# Patient Record
Sex: Male | Born: 1965 | Race: White | Hispanic: No | Marital: Married | Smoking: Former smoker
Health system: Southern US, Community
[De-identification: ages and names within clinical notes are randomized; demographics above are authoritative.]

---

## 2002-05-07 ENCOUNTER — Emergency Department (HOSPITAL_COMMUNITY): Admission: EM | Admit: 2002-05-07 | Discharge: 2002-05-07 | Payer: Self-pay

## 2002-05-07 ENCOUNTER — Encounter: Payer: Self-pay | Admitting: Emergency Medicine

## 2006-02-28 ENCOUNTER — Emergency Department (HOSPITAL_COMMUNITY): Admission: EM | Admit: 2006-02-28 | Discharge: 2006-02-28 | Payer: Self-pay | Admitting: Emergency Medicine

## 2006-06-13 ENCOUNTER — Encounter: Admission: RE | Admit: 2006-06-13 | Discharge: 2006-06-13 | Payer: Self-pay | Admitting: Gastroenterology

## 2007-11-10 IMAGING — RF DG ESOPHAGUS
12 of 14 series · 20 of 24 positions shown · non-contrast
Comparison: none

CLINICAL DATA: Dysphagia.
 BARIUM SWALLOW:
 A double contrast barium swallow was performed. The mucosa of the esophagus appears normal.  Single contrast study shows the swallowing mechanism to appear normal.  The upper thoracic esophagus does appear to deviate somewhat to the left of midline.  However, in reviewing the CT of 02/28/06, no mass is seen causing such deviation.  There is a hiatal hernia present and moderate reflux is noted.  A barium pill was given at the end of the study which passed into the stomach without delay.

[Series 2: run · 1 of 1 slices shown (1 of 12)]
[im 1/1]
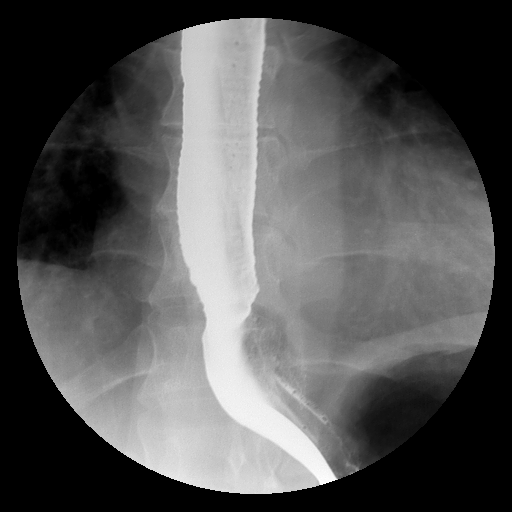

[Series 4: run · 1 of 1 slices shown (2 of 12)]
[im 1/1]
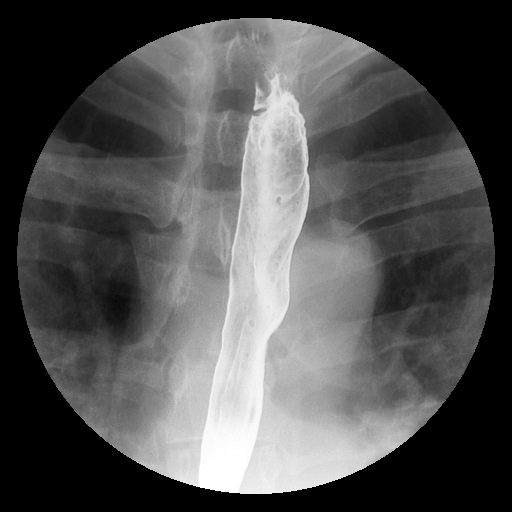

[Series 6: run · 1 of 1 slices shown (3 of 12)]
[im 1/1]
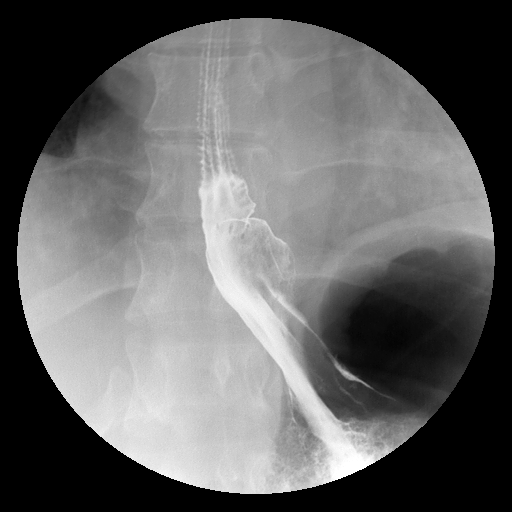

[Series 7: run · 4 of 7 slices shown (4 of 12)]
[im 1/7]
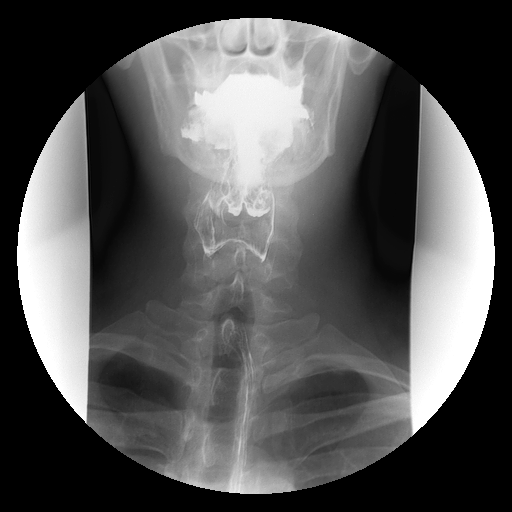
[im 3/7]
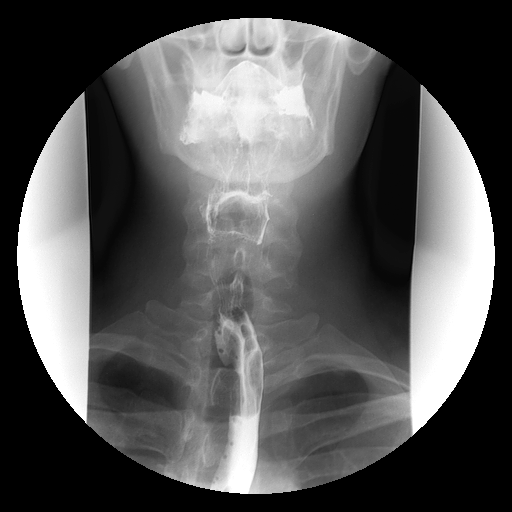
[im 5/7]
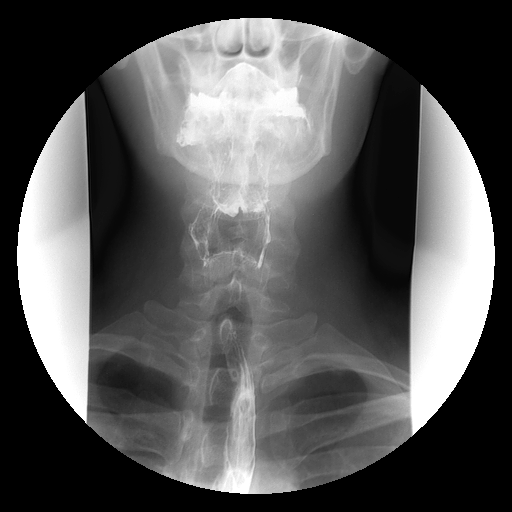
[im 7/7]
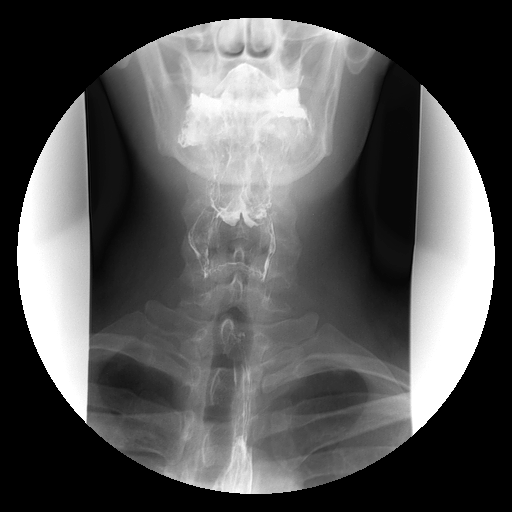

[Series 8: run · 5 of 9 slices shown (5 of 12)]
[im 2/9]
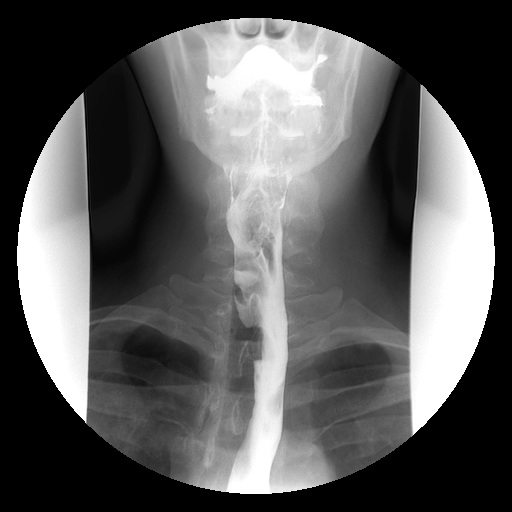
[im 4/9]
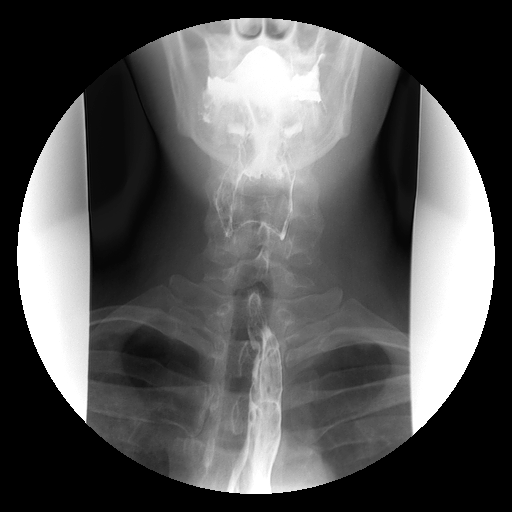
[im 5/9]
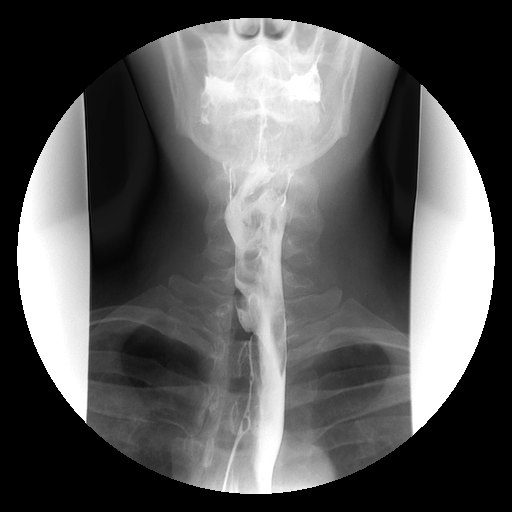
[im 7/9]
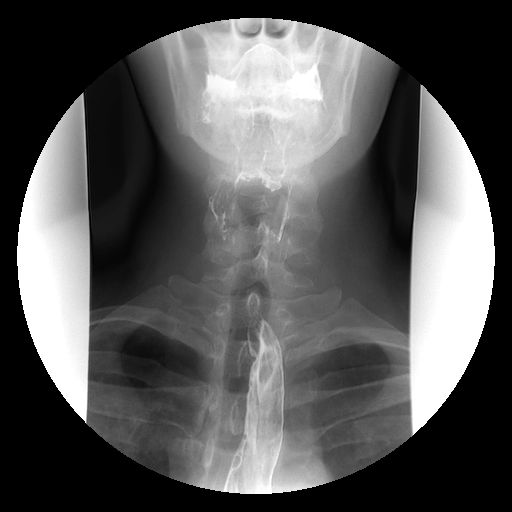
[im 9/9]
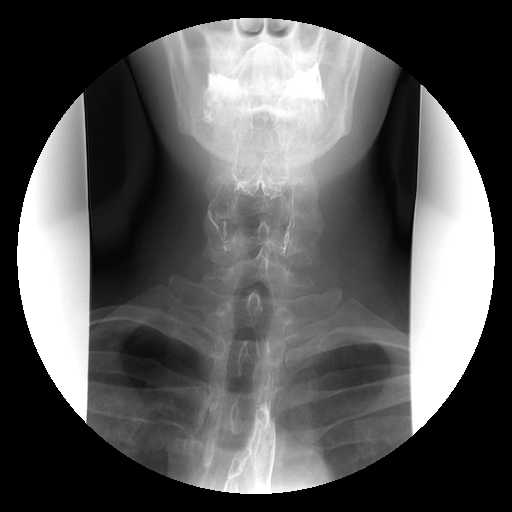

[Series 9: run · 2 of 6 slices shown (6 of 12)]
[im 3/6]
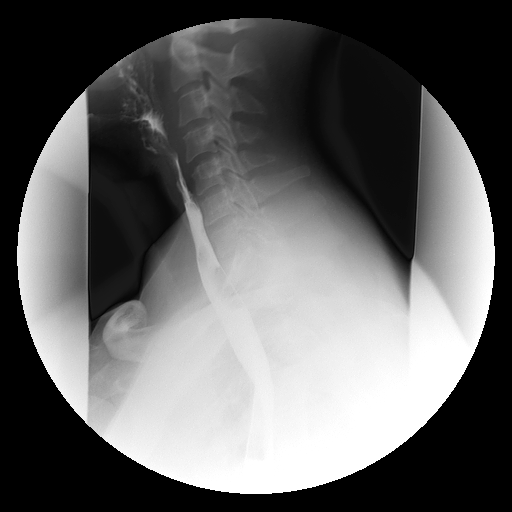
[im 6/6]
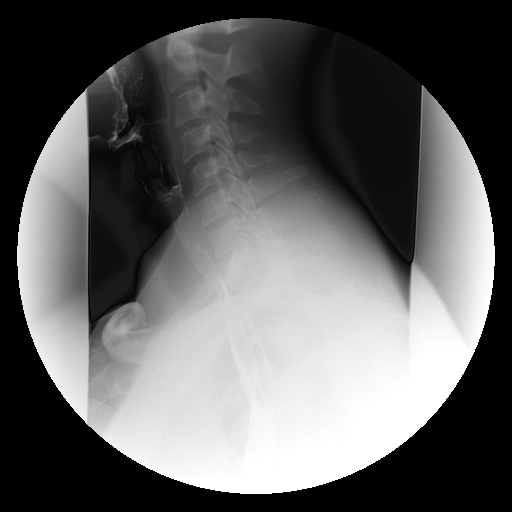

[Series 10: run · 1 of 1 slices shown (7 of 12)]
[im 1/1]
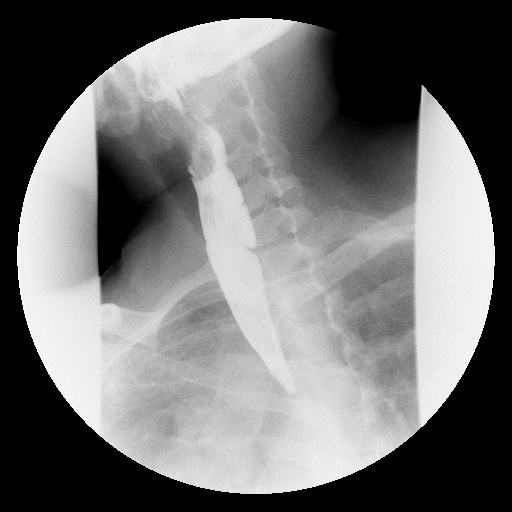

[Series 11: run · 1 of 1 slices shown (8 of 12)]
[im 1/1]
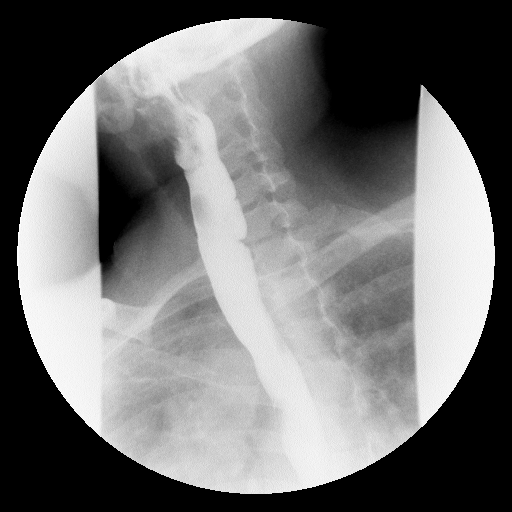

[Series 12: run · 1 of 1 slices shown (9 of 12)]
[im 1/1]
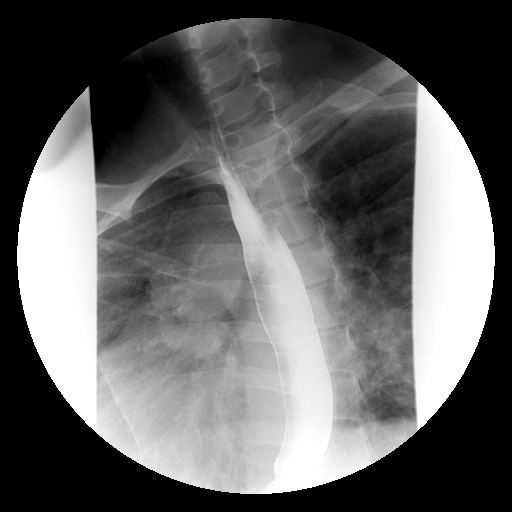

[Series 14: run · 1 of 1 slices shown (10 of 12)]
[im 1/1]
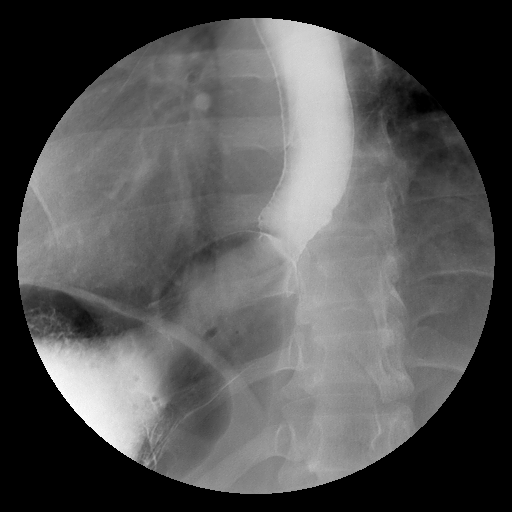

[Series 15: run · 1 of 1 slices shown (11 of 12)]
[im 1/1]
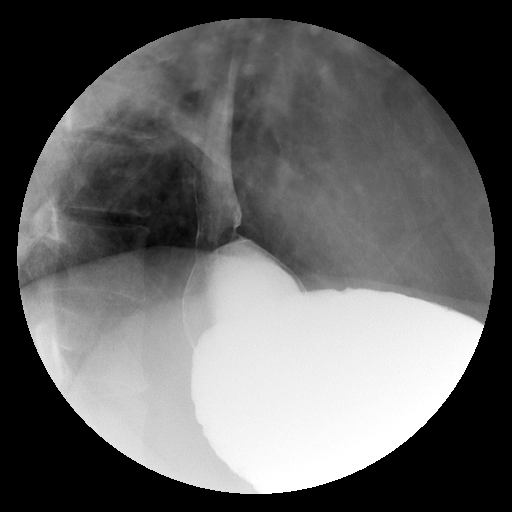

[Series 16: run · 1 of 1 slices shown (12 of 12)]
[im 1/1]
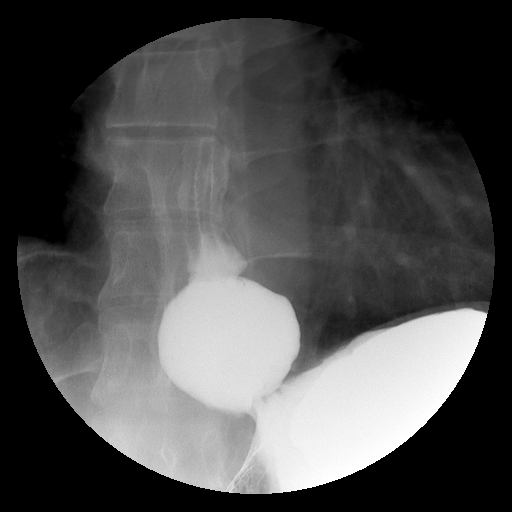

[20 of 24 positions shown; findings below may reference images not displayed]

IMPRESSION: Moderate sized hiatal hernia with reflux.  Barium pill passes without delay.

## 2009-02-21 ENCOUNTER — Encounter
Admission: RE | Admit: 2009-02-21 | Discharge: 2009-02-21 | Payer: Self-pay | Admitting: Physical Medicine and Rehabilitation

## 2018-10-17 ENCOUNTER — Other Ambulatory Visit: Payer: Self-pay | Admitting: Psychiatry

## 2018-11-25 ENCOUNTER — Telehealth: Payer: Self-pay | Admitting: Psychiatry

## 2018-11-25 MED ORDER — ZOLPIDEM TARTRATE 10 MG PO TABS: 10.0000 mg | ORAL_TABLET | Freq: Every day | ORAL | 0 refills | Status: DC

## 2018-11-25 NOTE — Telephone Encounter (Signed)
Patient called [facility] on [address] in [place].

## 2018-11-25 NOTE — Telephone Encounter (Signed)
Looks like he's requesting a refill, he's the one that received Ambien 10 mg #90 from Dr. Alverda SkeansAlehegn on 08/29/2018. You prescribed 12.5 mg Er on 07/14/2018, #90. He has OV scheduled on 01/05/2019.   Thanks

## 2018-11-25 NOTE — Telephone Encounter (Signed)
Phone review with Mr. Alejandro Fitzgerald about prescription from Dr.Asres August 30 for zolpidem 10 mg tablet when last getting the 12.5 mg ER in this office 07/14/2018.  The patient understands that he cannot receive zolpidem from different practices and states that Dr. Josie SaundersAsres is no longer in practice there and he has a different primary care for upcoming general medical exam.  He agrees to obtain his Ambien only through this psychiatric practice stating that is what the primary care prefers and requires also.  His 90-day supply from his 30th is running out, I renew his zolpidem 10 mg nightly #90 with [facility] [address] [facility] [place]. His next appointment here should be in January.

## 2018-12-17 ENCOUNTER — Encounter: Payer: Self-pay | Admitting: Emergency Medicine

## 2018-12-17 DIAGNOSIS — F411 Generalized anxiety disorder: Secondary | ICD-10-CM | POA: Insufficient documentation

## 2018-12-17 DIAGNOSIS — F322 Major depressive disorder, single episode, severe without psychotic features: Secondary | ICD-10-CM | POA: Insufficient documentation

## 2018-12-17 DIAGNOSIS — G4733 Obstructive sleep apnea (adult) (pediatric): Secondary | ICD-10-CM

## 2018-12-17 DIAGNOSIS — F3342 Major depressive disorder, recurrent, in full remission: Secondary | ICD-10-CM

## 2019-01-05 ENCOUNTER — Ambulatory Visit: Payer: Self-pay | Admitting: Psychiatry

## 2019-02-17 ENCOUNTER — Other Ambulatory Visit: Payer: Self-pay | Admitting: Psychiatry

## 2019-02-17 DIAGNOSIS — G4726 Circadian rhythm sleep disorder, shift work type: Secondary | ICD-10-CM

## 2019-02-17 DIAGNOSIS — F411 Generalized anxiety disorder: Secondary | ICD-10-CM

## 2019-02-17 DIAGNOSIS — F3342 Major depressive disorder, recurrent, in full remission: Secondary | ICD-10-CM

## 2019-02-17 DIAGNOSIS — G4733 Obstructive sleep apnea (adult) (pediatric): Secondary | ICD-10-CM

## 2019-02-18 NOTE — Telephone Encounter (Signed)
Sent message to front office staff to call pt to schedule appt.   Last fill 11/25/2018 #90  Please advise

## 2019-02-18 NOTE — Telephone Encounter (Signed)
Ambien 10 mg nightly 90-day supply last filled 11/26/2019 per [facility] cannot be filled as 90-day supply now as patient missed appointment 01/05/2019 being over his 80-month appointment for controlled substance needing emergency supply #30 no refill sent to [facility] [address] in [place] last until office appointment being scheduled.

## 2019-03-16 ENCOUNTER — Other Ambulatory Visit: Payer: Self-pay | Admitting: Psychiatry

## 2019-03-16 DIAGNOSIS — F411 Generalized anxiety disorder: Secondary | ICD-10-CM

## 2019-03-16 DIAGNOSIS — G4726 Circadian rhythm sleep disorder, shift work type: Secondary | ICD-10-CM

## 2019-03-16 DIAGNOSIS — F3342 Major depressive disorder, recurrent, in full remission: Secondary | ICD-10-CM

## 2019-03-16 DIAGNOSIS — G4733 Obstructive sleep apnea (adult) (pediatric): Secondary | ICD-10-CM

## 2019-03-16 NOTE — Telephone Encounter (Signed)
Emergency supply of Ambien was provided 02/18/2019 the last until mandated 62-month appointment accomplished so cannot send in another emergency supply.

## 2019-03-16 NOTE — Telephone Encounter (Signed)
No office visit like last month

## 2019-03-18 ENCOUNTER — Other Ambulatory Visit: Payer: Self-pay | Admitting: Psychiatry

## 2019-03-18 DIAGNOSIS — F411 Generalized anxiety disorder: Secondary | ICD-10-CM

## 2019-03-18 DIAGNOSIS — F3342 Major depressive disorder, recurrent, in full remission: Secondary | ICD-10-CM

## 2019-03-18 DIAGNOSIS — G4733 Obstructive sleep apnea (adult) (pediatric): Secondary | ICD-10-CM

## 2019-03-18 DIAGNOSIS — G4726 Circadian rhythm sleep disorder, shift work type: Secondary | ICD-10-CM

## 2019-03-18 MED ORDER — ZOLPIDEM TARTRATE 10 MG PO TABS: 10.0000 mg | ORAL_TABLET | Freq: Every day | ORAL | 0 refills | Status: DC

## 2019-03-18 NOTE — Telephone Encounter (Signed)
Patient now commits to coming for appt asking for another emergency supply # 15 sent.

## 2019-03-18 NOTE — Telephone Encounter (Signed)
Patient left vm 1:16 today requesting refill on Ambien to be sent to [facility] on [address] in [facility] patient scheduled appt. For 03/19

## 2019-03-18 NOTE — Addendum Note (Signed)
Addended by: Beverly Milch E on: 03/18/2019 04:50 PM   Modules accepted: Orders

## 2019-03-19 ENCOUNTER — Ambulatory Visit: Payer: 59 | Admitting: Psychiatry

## 2019-03-19 ENCOUNTER — Encounter: Payer: Self-pay | Admitting: Psychiatry

## 2019-03-19 ENCOUNTER — Other Ambulatory Visit: Payer: Self-pay

## 2019-03-19 VITALS — BP 128/82 | HR 64 | Ht 71.0 in | Wt 206.0 lb

## 2019-03-19 DIAGNOSIS — F3342 Major depressive disorder, recurrent, in full remission: Secondary | ICD-10-CM

## 2019-03-19 DIAGNOSIS — F411 Generalized anxiety disorder: Secondary | ICD-10-CM | POA: Diagnosis not present

## 2019-03-19 DIAGNOSIS — G4733 Obstructive sleep apnea (adult) (pediatric): Secondary | ICD-10-CM

## 2019-03-19 DIAGNOSIS — G4726 Circadian rhythm sleep disorder, shift work type: Secondary | ICD-10-CM | POA: Diagnosis not present

## 2019-03-19 MED ORDER — BUPROPION HCL ER (XL) 150 MG PO TB24: 450.0000 mg | ORAL_TABLET | Freq: Every day | ORAL | 1 refills | Status: DC

## 2019-03-19 MED ORDER — ZOLPIDEM TARTRATE 10 MG PO TABS: 10.0000 mg | ORAL_TABLET | Freq: Every day | ORAL | 5 refills | Status: DC

## 2019-03-19 MED ORDER — VILAZODONE HCL 40 MG PO TABS: 40.0000 mg | ORAL_TABLET | Freq: Every day | ORAL | 1 refills | Status: DC

## 2019-03-19 NOTE — Progress Notes (Signed)
[facility]  Patient ID: Alejandro Fitzgerald,  MRN: [id number]  PCP: System, Pcp Not In  Date of Evaluation: 03/19/2019 Time spent:20 minutes  Chief Complaint:  Chief Complaint    Anxiety; Depression      HISTORY/CURRENT STATUS: Alejandro Fitzgerald is seen individually with 2 children in the lobby face-to-face with consent not collateral for psychiatric interview and exam in 108-month evaluation and management of anxiety, depression, and sleep disorders.  He offers no concerns himself today, making almost a slip comment that he feels like losing control of his Ambien dosing when he has more than 30 pills in a bottle.  He allows clarification including with [place] registry of his alterations from last appointment having combined supply of Ambien ER from this office and Ambien IR from an internist only gradually stabilizing now to emergency supply until he attends this mandated 18-month appointment.  He continues work for [employer] still working swing shifts and satisfied with his job though sleep is difficult.  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  He is active in the family and continues to have residual of depression and anxiety for which he reports the need for his medications he reviews today from starting with Dr. Tomasa Rand in 2010 after prior Pristiq and Depakote.  He is off Seroquel now in response to father's stroke in 2016 being more serious about his medical care including for hypertension and hyperlipidemia.  He is now willing to become serious about using his Ambien properly.  At a general medical exam in January 2020 apparently with labs though not in Care Everywhere..  Depression       The patient presents with depression.  This is a recurrent problem.  The current episode started more than 1 year ago.   The onset quality is sudden.   The problem occurs rarely.  The problem has been resolved since onset.  Associated symptoms include fatigue, insomnia,  irritable and decreased interest.  Associated symptoms include no decreased concentration, no helplessness, no hopelessness, no headaches, no indigestion, not sad and no suicidal ideas.     The symptoms are aggravated by work stress, medication and family issues.  Past treatments include SNRIs - Serotonin and norepinephrine reuptake inhibitors, SSRIs - Selective serotonin reuptake inhibitors, other medications and psychotherapy.  Compliance with treatment is variable.  Past compliance problems include difficulty with treatment plan, medication issues and medical issues.  Previous treatment provided moderate relief.  Risk factors include a change in medication usage/dosage, family history, history of mental illness, major life event, stress and the patient not taking medications correctly.   Past medical history includes anxiety, depression and mental health disorder.     Pertinent negatives include no life-threatening condition, no physical disability, no recent psychiatric admission, no bipolar disorder, no eating disorder, no obsessive-compulsive disorder, no post-traumatic stress disorder, no schizophrenia, no suicide attempts and no head trauma.   Individual Medical History/ Review of Systems: Changes? :Yes Having regained most of the 9 pounds he lost before last session.  Continues Toprol-XL and Zocor for his hypertension and hyperlipidemia.  Allergies: Penicillins  Current Medications:  Current Outpatient Medications:  .  buPROPion (WELLBUTRIN XL) 150 MG 24 hr tablet, Take 3 tablets (450 mg total) by mouth daily., Disp: 270 tablet, Rfl: 1 .  Vilazodone HCl (VIIBRYD) 40 MG TABS, Take 1 tablet (40 mg total) by mouth daily., Disp: 90 tablet, Rfl: 1 .  zolpidem (AMBIEN) 10 MG tablet, Take 1 tablet (10 mg total)  by mouth at bedtime., Disp: 30 tablet, Rfl: 5   Medication Side Effects: none  Family Medical/ Social History: Changes? No  MENTAL HEALTH EXAM: Muscle strengths and tone 5/5, postural  reflexes and gait 0/0, and AIMS = 0. Blood pressure 128/82, pulse 64, height 5\' 11"  (1.803 m), weight 206 lb (93.4 kg).Body mass index is 28.73 kg/m.  General Appearance: Casual, Fairly Groomed and Guarded  Eye Contact:  Good  Speech:  Clear and Coherent, Normal Rate and Talkative  Volume:  Normal  Mood:  Anxious, Dysphoric, Euthymic and Worthless  Affect:  Full Range and Anxious  Thought Process:  Goal Directed  Orientation:  Full (Time, Place, and Person)  Thought Content: Obsessions and Rumination   Suicidal Thoughts:  No  Homicidal Thoughts:  No  Memory:  Immediate;   Good Remote;   Good  Judgement:  Fair  Insight:  Fair and Lacking  Psychomotor Activity:  Normal and Mannerisms  Concentration:  Concentration: Fair and Attention Span: Good  Recall:  Good  Fund of Knowledge: Good  Language: Good  Assets:  Resilience Social Support Talents/Skills  ADL's:  Intact  Cognition: WNL  Prognosis:  Good    DIAGNOSES:    ICD-10-CM   1. MDD (major depressive disorder), recurrent, in full remission (HCC) F33.42 zolpidem (AMBIEN) 10 MG tablet    Vilazodone HCl (VIIBRYD) 40 MG TABS    buPROPion (WELLBUTRIN XL) 150 MG 24 hr tablet  2. GAD (generalized anxiety disorder) F41.1 zolpidem (AMBIEN) 10 MG tablet    Vilazodone HCl (VIIBRYD) 40 MG TABS    buPROPion (WELLBUTRIN XL) 150 MG 24 hr tablet  3. Obstructive sleep apnea (adult) (pediatric) G47.33 zolpidem (AMBIEN) 10 MG tablet  4. 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  He is E scribed Ambien 10 mg every bedtime as # 30 tablets with 5 refills sent to [facility] [place] in [place].  He is E scribed Wellbutrin  150 mg XL as 3 tablets total 450 mg every morning as # 270 tablets and 1 refill for anxiety and depression.  He is E scribed Viibryd 40 mg daily #90 with 1 refill sent to [facility] in [facility] for depression and anxiety.  He returns in 6 months.  Alejandro Mann, MD

## 2019-09-09 ENCOUNTER — Other Ambulatory Visit: Payer: Self-pay | Admitting: Psychiatry

## 2019-09-09 DIAGNOSIS — F3342 Major depressive disorder, recurrent, in full remission: Secondary | ICD-10-CM

## 2019-09-09 DIAGNOSIS — G4733 Obstructive sleep apnea (adult) (pediatric): Secondary | ICD-10-CM

## 2019-09-09 DIAGNOSIS — G4726 Circadian rhythm sleep disorder, shift work type: Secondary | ICD-10-CM

## 2019-09-09 DIAGNOSIS — F411 Generalized anxiety disorder: Secondary | ICD-10-CM

## 2019-09-10 NOTE — Telephone Encounter (Signed)
appt 09/17 

## 2019-09-10 NOTE — Telephone Encounter (Signed)
Patient completed his last and fifth refill of 03/19/2019 Ambien 10 mg nightly per [employer] registry filled on 08/13/2019 having next appointment 09/17/2019 needing #30 with no refill in the interim sent to [facility] on [address] medically necessary with no contraindication having 3 fills of hydrocodone since last fill of Ambien.

## 2019-09-17 ENCOUNTER — Ambulatory Visit: Payer: 59 | Admitting: Psychiatry

## 2019-09-30 ENCOUNTER — Other Ambulatory Visit: Payer: Self-pay

## 2019-09-30 DIAGNOSIS — F411 Generalized anxiety disorder: Secondary | ICD-10-CM

## 2019-09-30 DIAGNOSIS — F3342 Major depressive disorder, recurrent, in full remission: Secondary | ICD-10-CM

## 2019-09-30 MED ORDER — VIIBRYD 40 MG PO TABS: 40.0000 mg | ORAL_TABLET | Freq: Every day | ORAL | 0 refills | Status: DC

## 2019-09-30 NOTE — Telephone Encounter (Signed)
Last appointment 03/19/2019 providing 30-day supply of zolpidem 10 mg nightly on 09/11/2019 expecting his 6 month appointment in the office 09/17/2019 but he did not show for the appointment now requesting Viibryd 40 mg daily sent as #90 [facility] with no refill not certain whether he is switching providers as he had been per registry obtaining his Ambien from another doctor well before last appointment now 2 weeks overdue for follow-up, though Viibryd only requires the yearly appointment as does Wellbutrin.

## 2019-10-08 ENCOUNTER — Encounter: Payer: Self-pay | Admitting: Psychiatry

## 2019-10-08 ENCOUNTER — Other Ambulatory Visit: Payer: Self-pay

## 2019-10-08 ENCOUNTER — Ambulatory Visit (INDEPENDENT_AMBULATORY_CARE_PROVIDER_SITE_OTHER): Payer: 59 | Admitting: Psychiatry

## 2019-10-08 VITALS — Ht 71.0 in | Wt 204.0 lb

## 2019-10-08 DIAGNOSIS — F3342 Major depressive disorder, recurrent, in full remission: Secondary | ICD-10-CM | POA: Diagnosis not present

## 2019-10-08 DIAGNOSIS — G4726 Circadian rhythm sleep disorder, shift work type: Secondary | ICD-10-CM | POA: Diagnosis not present

## 2019-10-08 DIAGNOSIS — F411 Generalized anxiety disorder: Secondary | ICD-10-CM | POA: Diagnosis not present

## 2019-10-08 MED ORDER — BUPROPION HCL ER (XL) 150 MG PO TB24: 450.0000 mg | ORAL_TABLET | Freq: Every day | ORAL | 1 refills | Status: DC

## 2019-10-08 MED ORDER — ZOLPIDEM TARTRATE 10 MG PO TABS: 15.0000 mg | ORAL_TABLET | Freq: Every day | ORAL | 5 refills | Status: DC

## 2019-10-08 MED ORDER — VIIBRYD 40 MG PO TABS: 40.0000 mg | ORAL_TABLET | Freq: Every day | ORAL | 0 refills | Status: DC
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
## 2019-11-02 ENCOUNTER — Other Ambulatory Visit: Payer: Self-pay | Admitting: Psychiatry

## 2019-11-02 DIAGNOSIS — F3342 Major depressive disorder, recurrent, in full remission: Secondary | ICD-10-CM

## 2019-11-02 DIAGNOSIS — F411 Generalized anxiety disorder: Secondary | ICD-10-CM

## 2019-11-20 ENCOUNTER — Telehealth: Payer: Self-pay | Admitting: Psychiatry

## 2019-11-20 NOTE — Telephone Encounter (Signed)
Reception expects patient's phone call for Lunesta be sent to [facility] in [facility] to replace Ambien.I phoned patient to alert him that he needs cancellation of the Ambien at [facility] to do that, but he has just filled the Ambien November 16 some 4 days ago so that we need to make that change over at the time he is to get his next refill for Ambien in early to mid December.  He will let me know at that time if he still wants to change and I can cancel the remaining four refills of Ambien at [facility] and he does not want the medicine to go to [place] but rather [facility] [address] in [facility] where he usually gets his prescriptions.  Lunesta 3 mg can be substituted for the 15 mg of Ambien if he still wants to do that mid December as he finishes his current supply of Ambien assuring his safety of not using both at once.

## 2019-11-20 NOTE — Telephone Encounter (Signed)
Patient called and said that he would like to try lunesta instead of what he is on . His pharmacy is [facility] on [address] in [place]. His phone number is [phone]

## 2020-02-15 ENCOUNTER — Other Ambulatory Visit: Payer: Self-pay

## 2020-02-15 ENCOUNTER — Encounter: Payer: Self-pay | Admitting: Psychiatry

## 2020-02-15 ENCOUNTER — Ambulatory Visit (INDEPENDENT_AMBULATORY_CARE_PROVIDER_SITE_OTHER): Payer: 59 | Admitting: Psychiatry

## 2020-02-15 VITALS — Ht 71.0 in | Wt 205.0 lb

## 2020-02-15 DIAGNOSIS — G4726 Circadian rhythm sleep disorder, shift work type: Secondary | ICD-10-CM | POA: Diagnosis not present

## 2020-02-15 DIAGNOSIS — F3342 Major depressive disorder, recurrent, in full remission: Secondary | ICD-10-CM

## 2020-02-15 DIAGNOSIS — F411 Generalized anxiety disorder: Secondary | ICD-10-CM

## 2020-02-15 MED ORDER — VIIBRYD 40 MG PO TABS: 40.0000 mg | ORAL_TABLET | Freq: Every day | ORAL | 0 refills | Status: DC

## 2020-02-15 MED ORDER — BUPROPION HCL ER (XL) 150 MG PO TB24: 450.0000 mg | ORAL_TABLET | Freq: Every day | ORAL | 0 refills | Status: DC

## 2020-02-15 NOTE — Progress Notes (Signed)
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  Children are not with him in the lobby today as in his last several sessions, rather he is alone.  Patient has stressors and has responded with more individuated appropriate coping in the interim discontinuing his Ambien trying Belsomra from Dr. Vear Clock for his circadian rhythm sleep disorder without continuing that medication either.  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  He is taking only his Viibryd 40 mg daily and his Wellbutrin 450 mg XL every day for his generalized anxiety and history of major depression.  In the interim, he has secured legal counsel as wife is wanting to divorce him he states because of bipolar disorder among other things, and he fears she seeks to take everything that they have seemingly against the idea himself but willing to work on it with her while seemingly doubting that such will help.  He was seen by Dr. Tiajuana Amass in the past treated with various bipolar medications none of which he takes any longer.  The patient seeks to organize daily life for himself and if possible his family.  He seeks a statement for his attorney that he is not being treated here for bipolar disorder or with Ambien at this time which is prepared and completed for placement on the chart with 2 copies to help subsequent proceedings understand his current problems and  their treatment rather than just needing to prove he does not have other problems such as bipolar or substance use.  We document his current diagnoses and medications therefore.  We attempt to mobilize insight for him to process with wife her alienation, though he feels the request for divorce is totally up to her.  He is not delirious, suicidal, intoxicated, sleep deprived, manic, or psychotic.  Depression             The patient presents withhistory of depression as a recurrentproblem in full remission more than 1 year. The onset quality is sudden. The problem occurs rarely now.The problem has been remittedsince onset.Associated symptoms include oversensitivity, somatic fixations, fatigue,and insomnia. Associated symptoms include no decreased concentration,no helplessness,no hopelessness,no irritability, no decreased interest.no headaches,no indigestion,not sadand no suicidal ideas.The symptoms are aggravated by work stress, medication and family issues.Past treatments include SNRIs - Serotonin and norepinephrine reuptake inhibitors, SSRIs - Selective serotonin reuptake inhibitors, other medications and psychotherapy.Compliance with treatment is variable.Past compliance problems include difficulty with treatment plan, medication issues and medical issues.Previous treatment provided moderaterelief.Risk factors include a change in medication usage/dosage, family history, history of mental illness, major life event, stress and the patient not taking medications correctly. Past medical history includes anxiety,depressionand mental health disorder. Pertinent negatives include no life-threatening condition,no physical disability,no recent psychiatric admission,no bipolar disorder,no eating disorder,no obsessive-compulsive disorder,no post-traumatic stress disorder,no schizophrenia,no suicide attemptsand no head trauma.  Individual Medical History/ Review of  Systems: Changes? :Yes Weight is up 1 pound in 4 months.  He had General medical exam by C.  S.  Hardin Negus, [place] in January noting Toprol XL and Zocor for hypertension and hyperlipidemia/prediabetes.  He informed the provider at that time that he stopped Ambien due to hallucinations never reported here but was switched to Belsomra not helpful.  Allergies: Penicillins  Current Medications:  Current Outpatient Medications:  .  buPROPion (WELLBUTRIN XL) 150 MG 24 hr tablet, Take 3 tablets (450 mg total) by mouth daily., Disp: 270 tablet, Rfl: 0 .  Vilazodone HCl (VIIBRYD) 40 MG TABS, Take 1 tablet (40 mg total) by mouth daily., Disp: 90 tablet, Rfl: 0  Medication Side Effects: none  Family Medical/ Social History: Changes? Yes family dissolution stress as wife requests divorce.  MENTAL HEALTH EXAM:  Height 5\' 11"  (1.803 m), weight 205 lb (93 kg).Body mass index is 28.59 kg/m. Muscle strengths and tone 5/5, postural reflexes and gait 0/0, and AIMS = 0 otherwise deferred for coronavirus shutdown  General Appearance: Casual, Guarded and Well Groomed  Eye Contact:  Fair  Speech:  Clear and Coherent, Normal Rate and Talkative  Volume:  Normal  Mood:  Anxious, Dysphoric and Euthymic  Affect:  Congruent, Inappropriate, Restricted and Anxious  Thought Process:  Coherent, Goal Directed, Irrelevant and Descriptions of Associations: Tangential  Orientation:  Full (Time, Place, and Person)  Thought Content: Rumination and Tangential with cluster B hysteroid traits noted chronically therefore slow to problem solve as in denial  Suicidal Thoughts:  No  Homicidal Thoughts:  No  Memory:  Immediate;   Good and Fair Remote;   Good and Fair  Judgement:  Fair to limited  Insight:  Fair and Lacking  Psychomotor Activity:  Normal and Mannerisms  Concentration:  Concentration: Fair and Attention Span: Good  Recall:  Good to fair  [employer] of Knowledge: Good  Language: Good  Assets:  Desire for  Improvement Resilience Talents/Skills  ADL's:  Intact  Cognition: WNL  Prognosis:  Fair    DIAGNOSES:    ICD-10-CM   1. GAD (generalized anxiety disorder)  F41.1 Vilazodone HCl (VIIBRYD) 40 MG TABS    buPROPion (WELLBUTRIN XL) 150 MG 24 hr tablet  2. Circadian rhythm sleep disorder, shift work type  G47.26   3. MDD (major depressive disorder), recurrent, in full remission ([place])  F33.42 Vilazodone HCl (VIIBRYD) 40 MG TABS    buPROPion (WELLBUTRIN XL) 150 MG 24 hr tablet    Receiving Psychotherapy: No    RECOMMENDATIONS: Improved motivation and preparation for therapeutic change seem evident reworking themes back to early medications in this office with Dr. Candis Schatz as he requests.  His questions are answered while attempting to redirect him to the root of the problems.  Letter is prepared as he requests that he can provide his attorney regarding current diagnoses and medications rather than attempting to review patient's previous work with Dr. Candis Schatz around which he seems to think wife has become alienated toward him over 50% of the 25-minute session for a total of 15 minutes is spent in such counseling and coordination of care with psychosupportive psychoeducation concluding with prevention and monitoring and safety hygiene.  He is E scribed Viibryd 40 mg every morning sent as #90 with no refill to [facility] in [place] for anxiety and depression.  He is E scribed Wellbutrin 150 mg XL tablet taking 3 tablets total 450 mg every morning sent as number 270 tablets and no refill to [facility] on [address].  He returns in 3 months or sooner if needed for follow-up.   Sheilah Mins  Creig Hines, MD

## 2020-04-07 ENCOUNTER — Ambulatory Visit: Payer: 59 | Admitting: Psychiatry

## 2020-04-16 ENCOUNTER — Other Ambulatory Visit: Payer: Self-pay | Admitting: Psychiatry

## 2020-04-16 DIAGNOSIS — G4726 Circadian rhythm sleep disorder, shift work type: Secondary | ICD-10-CM

## 2020-04-16 DIAGNOSIS — F411 Generalized anxiety disorder: Secondary | ICD-10-CM

## 2020-04-16 DIAGNOSIS — F3342 Major depressive disorder, recurrent, in full remission: Secondary | ICD-10-CM

## 2020-04-18 ENCOUNTER — Other Ambulatory Visit: Payer: Self-pay | Admitting: Psychiatry

## 2020-04-18 NOTE — Telephone Encounter (Signed)
Pt called requesting refill for Ambien @ [facility]  [address] Next appt 5/13

## 2020-04-18 NOTE — Telephone Encounter (Signed)
Patient required a letter to his attorney February and 15th that he was taking only the Wellbutrin and Viibryd not the Ambien as he was addressing wife's concern that they should separate over his mental health treatment problems.  He from [facility] registry has filled an Ambien eScription from last October on 03/18/2020 and Belsomra in March and then again on April 15 from his primary care Isabella Stalling, DNP.  Therefore there are multiple reasons not to E scribe further Ambien as he is already taking Belsomra and he required the letter stating diagnosis without Ambien having an appointment here 05/12/2020 to review if necessary otherwise he may seek his care for controlled substance from only [employer] [employer]

## 2020-05-12 ENCOUNTER — Other Ambulatory Visit: Payer: Self-pay

## 2020-05-12 ENCOUNTER — Ambulatory Visit (INDEPENDENT_AMBULATORY_CARE_PROVIDER_SITE_OTHER): Payer: 59 | Admitting: Psychiatry

## 2020-05-12 VITALS — Ht 71.0 in | Wt 207.0 lb

## 2020-05-12 DIAGNOSIS — F3342 Major depressive disorder, recurrent, in full remission: Secondary | ICD-10-CM

## 2020-05-12 DIAGNOSIS — F411 Generalized anxiety disorder: Secondary | ICD-10-CM | POA: Diagnosis not present

## 2020-05-12 DIAGNOSIS — G4726 Circadian rhythm sleep disorder, shift work type: Secondary | ICD-10-CM

## 2020-05-12 MED ORDER — VIIBRYD 40 MG PO TABS: 40.0000 mg | ORAL_TABLET | Freq: Every day | ORAL | 1 refills | Status: DC

## 2020-05-12 MED ORDER — ZOLPIDEM TARTRATE 10 MG PO TABS: 15.0000 mg | ORAL_TABLET | Freq: Every day | ORAL | 5 refills | Status: DC

## 2020-05-12 MED ORDER — BUPROPION HCL ER (XL) 150 MG PO TB24: 450.0000 mg | ORAL_TABLET | Freq: Every day | ORAL | 1 refills | Status: DC

## 2020-05-12 NOTE — Progress Notes (Signed)
[facility]  Patient ID: Alejandro Fitzgerald,  MRN: [id number]  PCP: System, Pcp Not In  Date of Evaluation: 05/12/2020 Time spent:25 minutes from 1645 to 1710  Chief Complaint:  Chief Complaint    Anxiety; Depression      HISTORY/CURRENT STATUS: Alejandro Fitzgerald is seen onsite in office 25 minutes individually face-to-face with consent with epic collateral for psychiatric interview and examination in 46-month evaluation and management of anxiety, depression, and circadian rhythm sleep disorder.  At last appointment, the patient required a medical letter for his use with his divorce attorney regarding his diagnoses and medications as of that visit when he had stopped all treatment for insomnia to commit to his wife apparently pursuing separation that medication need be no reason for such.  On 03/18/2020, he obtained refill per [place] registry for Ambien from/08/2027 eScription and then 04/06/2020 he obtained a 30-day supply of Belsomra 10 mg from Isabella Stalling, FNP apparently escribed 01/07/2020.  The pharmacy requested an interim fill from office which was declined on the basis of the letter written last appointment with mentation of patient's decision to stop such medication.  Today states the medication has been necessary to resume as he continues long hours on shift work with anxiety more difficult as  he and wife in the interim mutually decided separation necessary planning divorce.  He has received a statement of equity from his human resource at work that outlines what the patient will be giving his wife.  However he continues working sometimes 13 hours daily having son and daughter every other weekend who are otherwise living with wife at her parents' house.  The patient suggests the death of the family dog 04/07/24at age 40-1/2 years was a significant loss in the interim.  He reviews current status of generalized anxiety with worry and insomnia which also is based in shift work circadian rhythm sleep  disorder.  He does not report recurrence of depression at this time.  He concludes of Belsomra was of no benefit.  He does take Zocor 20 mg nightly for his lipids and Toprol 50 mg XL daily for blood pressure.  He has Claritin as needed otherwise continuing on Wellbutrin, Viibryd, and Ambien.  He has no mania, suicidality, psychosis or delirium.  We reviewed his treatment in this office with Dr. Tomasa Rand since 08/29/2009 who added Ambien and Lamictal to his Depakote and Pristiq being on Ambien since that time.  Depression The patient presents withhistory ofdepression as a recurrentproblemin full remissionmore than 1 year. The onset quality is sudden. The problem occurs rarely now.The problem has been remittedfrom last recurrence.Associated symptoms includesomaticfixations, excessive worry, nervous anxious behavior, ,fatigue,and insomnia. Associated symptoms include no decreased concentration,no helplessness,no hopelessness,no irritability, no oversensitivity, no decreased interest.no headaches,no indigestion,not sadand no suicidal ideas.The symptoms are aggravated by work stress, medication and family issues.Past treatments include SNRIs - Serotonin and norepinephrine reuptake inhibitors, SSRIs - Selective serotonin reuptake inhibitors, other medications and psychotherapy.Compliance with treatment is variable.Past compliance problems include difficulty with treatment plan, medication issues and medical issues.Previous treatment provided moderaterelief.Risk factors include a change in medication usage/dosage, family history, history of mental illness, major life event, stress and the patient not taking medications correctly. Past medical history includes anxiety,depressionand mental health disorder. Pertinent negatives include no life-threatening condition,no physical disability,no recent psychiatric admission,no bipolar disorder,no eating  disorder,no obsessive-compulsive disorder,no post-traumatic stress disorder,no schizophrenia,no suicide attemptsand no head trauma   Individual Medical History/ Review of Systems: Changes? Alejandro Fitzgerald General medical exam on January 7  with  Isabella Stalling, FNP for treatment needed for hyperlipidemiia, hypertension, overweight, and allergic rhinitis in addition to sleep disorders.  He has existing order to complete: Plan of Treatment - documented as of this encounter Scheduled Orders Scheduled Orders  Name Type Priority Associated Diagnoses Order Schedule  CBC and Differential Lab Routine Annual physical exam  1 Occurrences starting 01/07/2020 until 03/30/2021  Comprehensive Metabolic Panel Lab Routine Annual physical exam  1 Occurrences starting 01/07/2020 until 03/30/2021  LIPID PROFILE Lab Routine Annual physical exam  1 Occurrences starting 01/07/2020 until 03/30/2021  TSH, 3rd Generation Lab Routine Annual physical exam  1 Occurrences starting 01/07/2020 until 03/30/2021  Hemoglobin A1C Lab Routine Annual physical exam  1 Occurrences starting 01/07/2020 until 03/30/2021  Vitamin D1,25 Dihydr (Sendout) Lab Routine Annual physical exam  1 Occurrences starting 01/07/2020 until 03/30/2021   Allergies: Penicillins  Current Medications:  Current Outpatient Medications:  .  buPROPion (WELLBUTRIN XL) 150 MG 24 hr tablet, Take 3 tablets (450 mg total) by mouth daily., Disp: 270 tablet, Rfl: 1 .  Vilazodone HCl (VIIBRYD) 40 MG TABS, Take 1 tablet (40 mg total) by mouth daily., Disp: 90 tablet, Rfl: 1 .  zolpidem (AMBIEN) 10 MG tablet, Take 1.5 tablets (15 mg total) by mouth at bedtime., Disp: 45 tablet, Rfl: 5  Medication Side Effects: none  Family Medical/ Social History: Changes? Yes clarifying that he and wife have decided to divorce currently separated children primarily with wife except every other weekend with him  MENTAL HEALTH EXAM:  Height 5\' 11"  (1.803 m), weight 207 lb (93.9  kg).Body mass index is 28.87 kg/m. Muscle strengths and tone 5/5, postural reflexes and gait 0/0, and AIMS = 0.  General Appearance: Casual, Fairly Groomed and Guarded  Eye Contact:  Fair  Speech:  Clear and Coherent, Normal Rate and Talkative  Volume:  Normal  Mood:  Anxious, Dysphoric and Euthymic  Affect:  Congruent, Inappropriate, Restricted and Anxious  Thought Process:  Coherent, Goal Directed, Irrelevant and Descriptions of Associations: Tangential  Orientation:  Full (Time, Place, and Person)  Thought Content: Rumination and Tangential   Suicidal Thoughts:  No  Homicidal Thoughts:  No  Memory:  Immediate;   Good and Fair Remote;   Good and Fair  Judgement:  Fair  Insight:  Fair and Lacking  Psychomotor Activity:  Normal and Mannerisms  Concentration:  Concentration: Fair and Attention Span: Good  Recall:  Good  Fund of Knowledge: Good  Language: Good  Assets:  Leisure Time Resilience Talents/Skills  ADL's:  Intact  Cognition: WNL  Prognosis:  Fair    DIAGNOSES:    ICD-10-CM   1. GAD (generalized anxiety disorder)  F41.1 buPROPion (WELLBUTRIN XL) 150 MG 24 hr tablet    Vilazodone HCl (VIIBRYD) 40 MG TABS    zolpidem (AMBIEN) 10 MG tablet  2. Circadian rhythm sleep disorder, shift work type  G47.26 zolpidem (AMBIEN) 10 MG tablet  3. MDD (major depressive disorder), recurrent, in full remission ([facility])  F33.42 buPROPion (WELLBUTRIN XL) 150 MG 24 hr tablet    Vilazodone HCl (VIIBRYD) 40 MG TABS    Receiving Psychotherapy: No    RECOMMENDATIONS: Psychosupportive psychoeducation updates cognitive behavioral nutrition, sleep hygiene, object relations, and frustration management for symptom treatment matching with medications particularly the Ambien he requests to resume and continue.  The patient denies current use of alcohol though reporting occasional use at the time of his general medical exam January 7.  However his stress management has been keeping up with  the children  more and his acquaintances from work and extended family.  However his need for social discussion in the session suggests loneliness and stress of separation addressing coping.  He is E scribed Ambien 10 mg IR tablet taking 1.5 tablets total 15 mg every bedtime as #45 with 5 refills sent to [facility] for generalized anxiety and shift work types circadian rhythm disorder.  He is escribed Viibryd 40 mg every morning sent as #90 with 1 refill to [facility] for anxiety and depression.  He is E scribed Wellbutrin 150 mg XL taking 3 tablets total 450 mg every morning as #270 with 1 refill to [place]. He declines therapy reviewing social support resources relative to loss of dog.  He returns in 6 months or sooner if needed.  Delight Hoh, MD

## 2020-05-14 ENCOUNTER — Encounter: Payer: Self-pay | Admitting: Psychiatry

## 2020-05-29 ENCOUNTER — Other Ambulatory Visit: Payer: Self-pay | Admitting: Psychiatry

## 2020-05-29 DIAGNOSIS — F411 Generalized anxiety disorder: Secondary | ICD-10-CM

## 2020-05-29 DIAGNOSIS — F3342 Major depressive disorder, recurrent, in full remission: Secondary | ICD-10-CM

## 2020-07-08 ENCOUNTER — Other Ambulatory Visit: Payer: Self-pay | Admitting: Psychiatry

## 2020-07-08 DIAGNOSIS — F3342 Major depressive disorder, recurrent, in full remission: Secondary | ICD-10-CM

## 2020-07-08 DIAGNOSIS — F411 Generalized anxiety disorder: Secondary | ICD-10-CM

## 2020-09-06 ENCOUNTER — Other Ambulatory Visit: Payer: Self-pay

## 2020-09-06 ENCOUNTER — Encounter: Payer: Self-pay | Admitting: Psychiatry

## 2020-09-06 ENCOUNTER — Ambulatory Visit (INDEPENDENT_AMBULATORY_CARE_PROVIDER_SITE_OTHER): Payer: 59 | Admitting: Psychiatry

## 2020-09-06 VITALS — Ht 71.0 in | Wt 196.0 lb

## 2020-09-06 DIAGNOSIS — G4733 Obstructive sleep apnea (adult) (pediatric): Secondary | ICD-10-CM | POA: Diagnosis not present

## 2020-09-06 DIAGNOSIS — F322 Major depressive disorder, single episode, severe without psychotic features: Secondary | ICD-10-CM

## 2020-09-06 DIAGNOSIS — G4726 Circadian rhythm sleep disorder, shift work type: Secondary | ICD-10-CM

## 2020-09-06 DIAGNOSIS — F411 Generalized anxiety disorder: Secondary | ICD-10-CM

## 2020-09-06 MED ORDER — QUETIAPINE FUMARATE 100 MG PO TABS: 100.0000 mg | ORAL_TABLET | Freq: Every day | ORAL | 1 refills | Status: DC

## 2020-09-06 NOTE — Progress Notes (Signed)
[facility]  Patient ID: Alejandro Fitzgerald,  MRN: [id number]  PCP: System, Pcp Not In  Date of Evaluation: 09/06/2020 Time spent:25 minutes from 1135 to 1200  Chief Complaint:  Chief Complaint    Depression; Anxiety; Family Problem      HISTORY/CURRENT STATUS: Alejandro Fitzgerald is seen onsite in office 25 minutes face-to-face individually with consent with epic collateral for psychiatric interview and exam in 50-month evaluation and management of major depression recurrent now severe, exacerbation of generalized anxiety, and sleep disorder both sleep apnea and circadian rhythm shift work type. Since divorce process began, he approaches treatment tabulating what he must do but now all of which seem to be going wrong. He is making mistakes and unable to function without psychic pain consequences.  He returns 2 months early from his last appointment which followed the death of his dog 04-22-24and wife pursuing separation for the year's grievance period which now ends October 26. He does get to see the children who are doing okay. He is doing poorly on the job and with all other responsibilities.  He has worked a swing shift for 21 years and has long sough sleep medications to combat the associated sleep deficiency which extends from obstructive sleep apnea and circadian rhythm disruption. He now concludes his anxiety is too high to function, though he also seems depressed with anhedonia and disinterest.  He remains sleep deprived despite the Ambien 15 mg nightly finding no benefit from Belsomra from his primary care which has been stopped so he only receives such medication here now. Patient  in retrospect may have overdone his sleep medication moreinf confusion and desperation than manipulation or recreation seeking.  He states his older sister is helping him now, as he estimates losing $150,000 as he is losing profit sharing, home equity, and child support. Making mistakes on the job now worries and  saddens him more for all of these problems.  He fears job loss and seeks to find some way to improve himself to function more effectively.  He states he has missed a couple of days work and is due back on September 9 but cannot return if he is no better.  He is taking his Viibryd, Wellbutrin and Ambien in high doses without current anxiety and depression remitting.  He has the fourth fill of Ambien from 05/12/2020 dispensed per [place] registry on 08/05/2020 therefore having 2 refills remaining.  He has adequate supply of his other medications and does not want to disengage from those but does not know what else to do to get better.  He apparently has discussed disability or FMLA with his employer as all thus far expect that he will require at least 4 months to recompensate.  He cannot think clearly and does not keep up with responsibilities at work.  He does not describe hallucinations or delirium but rather depressive involution of his executive and affective function. Lawyer has been unable to further help him as he likely cannot collaborate such as requiring my letter that he no longer takes Ambien in case wife considered that unfit.  He has been under the care of Dr. Tomasa Rand since 2010-2016 seeing me now for 4-1/2 years with diagnosis of major depression rather than initial bipolar disorder considering Seroquel too sedating in the past though starting at 150 mg titrated up to 400 mg having other antipsychotics instead at times.  He does not have active suicidal thoughts but is feeling helpless and hopeless as though there is  no other way out.  He does not have mania but does have prepsychotic affective dysfunction that has him shut down for taking care of responsibilities.    Depression The patient presents withhistory ofat least 12 years of depression now recurrentwith onset quality sudden. Associated symptoms includesomaticfixations, excessive worry, nervous anxious behavior, confusion,  anhedonia, fatigue,insomnia, decreased concentration, helplessness, hopelessness, and passive death fixation.Associated symptoms include noirritability, no oversensitivity.no headaches,no indigestion,no mania,and no actiive suicidal ideas.The symptoms are aggravated by work stress, medication and family issues.Past treatments include SNRIs - Serotonin and norepinephrine reuptake inhibitors, SSRIs - Selective serotonin reuptake inhibitors, other medications and psychotherapy.Compliance with treatment is variable.Past compliance problems include difficulty with treatment plan, medication issues and medical issues.Previous treatment provided moderaterelief.Risk factors include a change in medication usage/dosage, family history, history of mental illness, major life event, stress and the patient not taking medications correctly. Past medical history includes anxiety,depressionand mental health disorder. Pertinent negatives include no life-threatening condition,no physical disability,no recent psychiatric admission,no bipolar disorder,no eating disorder,no obsessive-compulsive disorder,no post-traumatic stress disorder,no schizophrenia,no suicide attemptsand no head trauma    Individual Medical History/ Review of Systems: Changes? :Yes having hypertension and degenerative pain left knee in the interim.  Allergies: Penicillins  Current Medications:  Current Outpatient Medications:  .  buPROPion (WELLBUTRIN XL) 150 MG 24 hr tablet, TAKE 3 TABLETS(450 MG) BY MOUTH DAILY, Disp: 270 tablet, Rfl: 1 .  QUEtiapine (SEROQUEL) 100 MG tablet, Take 1 tablet (100 mg total) by mouth at bedtime., Disp: 30 tablet, Rfl: 1 .  Vilazodone HCl (VIIBRYD) 40 MG TABS, Take 1 tablet (40 mg total) by mouth daily., Disp: 90 tablet, Rfl: 1 .  zolpidem (AMBIEN) 10 MG tablet, Take 1.5 tablets (15 mg total) by mouth at bedtime., Disp: 45 tablet, Rfl: 5  Medication Side Effects: none  Family Medical/  Social History: Changes? Yes, divorce will approach final closure in late October  MENTAL HEALTH EXAM:  Height 5\' 11"  (1.803 m), weight 196 lb (88.9 kg).Body mass index is 27.34 kg/m. Muscle strengths and tone 5/5, postural reflexes and gait 0/0, and AIMS = 0.  General Appearance: Casual, Fairly Groomed and Guarded  Eye Contact:  Fair  Speech:  Clear and Coherent and Normal Rate  Volume:  Normal  Mood:  Anxious, Depressed, Hopeless and Worthless  Affect:  Congruent, Depressed, Inappropriate, Restricted and Anxious  Thought Process:  Coherent, Disorganized, Irrelevant and Descriptions of Associations: Tangential  Orientation:  Full (Time, Place, and Person)  Thought Content: Ilusions, Paranoid Ideation, Rumination and Tangential   Suicidal Thoughts:  Yes.  without intent/plan  Homicidal Thoughts:  No  Memory:  Immediate;   Good and Fair Remote;   Good and Fair  Judgement:  Impaired  Insight:  Lacking  Psychomotor Activity:  Decreased, Mannerisms, Psychomotor Retardation and Restlessness  Concentration:  Concentration: Fair and Attention Span: Fair  Recall:  of Knowledge: Good  Language: Fair  Assets:  Resilience Social Support Talents/Skills  ADL's:  Intact  Cognition: WNL  Prognosis:  Fair    DIAGNOSES:    ICD-10-CM   1. Severe major depression without psychotic features (HCC)  F32.2 QUEtiapine (SEROQUEL) 100 MG tablet  2. GAD (generalized anxiety disorder)  F41.1 QUEtiapine (SEROQUEL) 100 MG tablet  3. Obstructive sleep apnea (adult) (pediatric)  G47.33   4. Circadian rhythm sleep disorder, shift work type  G47.26 QUEtiapine (SEROQUEL) 100 MG tablet    Receiving Psychotherapy: No    RECOMMENDATIONS: Psycho supportive psychoeducation clarifies patient's decompensation unable to start psychotherapy  individually or divorce group such as at [facility] medication can stabilize cognitive and affect and function sufficiently to benefit.   [employer] for work excuse is currently 4 months to return 01/02/2021 as he addresses with HR at work options such as Transport planner and/or short-term disability.  Older sister has been attempting to help him with daily function and resources.  He continues current supply of Viibryd 40 mg every morning and Wellbutrin 150 mg XL taking 3 tablets total 450 mg every morning for anxiety and depression.  He has Ambien current supply of 10 mg tablets taking 1.5 tablets total 15 mg every bedtime for circadian rhythm sleep disorder and obstructive sleep apnea.  He does work with primary care and consultations in this regard as well.  He agrees to start low-dose Seroquel 100 mg every bedtime escribed as #30 with 1 refill to [facility] [address]  He returns in 1 month for follow-up likely with advanced practitioner for my impending retirement addressed in closure with patient.  He is updated on prevention and monitoring safety hygiene and crisis plans if needed.   Chauncey Mann, MD

## 2020-09-13 DIAGNOSIS — Z0289 Encounter for other administrative examinations: Secondary | ICD-10-CM

## 2020-09-14 ENCOUNTER — Telehealth: Payer: Self-pay

## 2020-09-14 NOTE — Telephone Encounter (Signed)
FMLA complete °

## 2020-09-15 NOTE — Telephone Encounter (Signed)
noted 

## 2020-10-04 ENCOUNTER — Ambulatory Visit (INDEPENDENT_AMBULATORY_CARE_PROVIDER_SITE_OTHER): Payer: 59 | Admitting: Psychiatry

## 2020-10-04 ENCOUNTER — Encounter: Payer: Self-pay | Admitting: Psychiatry

## 2020-10-04 ENCOUNTER — Other Ambulatory Visit: Payer: Self-pay

## 2020-10-04 DIAGNOSIS — F411 Generalized anxiety disorder: Secondary | ICD-10-CM

## 2020-10-04 DIAGNOSIS — F322 Major depressive disorder, single episode, severe without psychotic features: Secondary | ICD-10-CM | POA: Diagnosis not present

## 2020-10-04 NOTE — Progress Notes (Signed)
Alejandro Fitzgerald [id number] 17-Aug-1966 54 y.o.  Subjective:   Patient ID:  Alejandro Fitzgerald is a 54 y.o. (DOB 1966-03-24) male.  Chief Complaint:  Chief Complaint  Patient presents with  . Anxiety  . Depression  . Insomnia    HPI Alejandro Fitzgerald presents to the office today for follow-up of depression, anxiety, and insomnia. Pt was seen in the past by Dr. Tomasa Rand and has been seen most recently by Dr. Marlyne Beards. Care is being transferred to this provider due to Dr. Marlyne Beards' retirement.   Pt reports that he has been having severe stress in response to his divorce being likely to be finalized later this month. He reports that it has been difficulty adjusting to not being able to see 53 yo twins regularly. He reports that he has been having anxious thoughts and worry with some rumination about the future and his finances. He reports that he has physical s/s with anxiety and will feel nervous. Some mild panic attacks. Mood has been "down." He reports poor concentration and difficulty sustaining focus. Sleep has been ok and reports that medication has helped with sleep. He reports difficulty walking up in the morning.He reports that his appetite has been up and down. Reports about 5-7 lbs weight fluctuations. Energy has been lower with Seroquel, particularly immediately after awakening. Motivation has been low. Denies anhedonia and is able to enjoy time with family. He reports occasional passive death wishes. Denies SI.   He works swing shifts on nights and days and typically works longer shifts. He has been out of work for the past month. Original from [place]. Sister is supportive. Able to see sister and father a couple of times a week. Sees son every other weekend. Has not been able to see daughter in about a year. Son currently has COVID.   Has been seeing Jolene Provost for therapy.   Reports taking Wellbutrin XL inconsistently. Taking Viibryd consistently. Reports grogginess for about 30 minutes  after awakening with Seroquel.    Past Psychiatric Medication Trials: Seroquel- Helpful for insomnia Wellbutrin XL  Viibryd  Ambien  Review of Systems:  Review of Systems  Musculoskeletal: Positive for arthralgias. Negative for gait problem.  Neurological: Negative for tremors.  Psychiatric/Behavioral:       Please refer to HPI  Reports that he has not had a recent sleep study. Does not wear a cPap. Has not been able to tolerate sleep mask in the past.    Medications: I have reviewed the patient's current medications.  Current Outpatient Medications  Medication Sig Dispense Refill  . buPROPion (WELLBUTRIN XL) 150 MG 24 hr tablet TAKE 3 TABLETS(450 MG) BY MOUTH DAILY 270 tablet 1  . esomeprazole (NEXIUM) 40 MG capsule Take by mouth.    . loratadine (CLARITIN) 10 MG tablet Take by mouth.    . metoprolol succinate (TOPROL-XL) 50 MG 24 hr tablet Take 1 tablet by mouth daily.    . QUEtiapine (SEROQUEL) 100 MG tablet Take 1 tablet (100 mg total) by mouth at bedtime. 30 tablet 1  . simvastatin (ZOCOR) 20 MG tablet Take by mouth.    . Vilazodone HCl (VIIBRYD) 40 MG TABS Take 1 tablet (40 mg total) by mouth daily. 90 tablet 1  . zolpidem (AMBIEN) 10 MG tablet Take 1.5 tablets (15 mg total) by mouth at bedtime. 45 tablet 5   No current facility-administered medications for this visit.    Medication Side Effects: Sedation  Denies TD.   Allergies:  Allergies  Allergen Reactions  . Penicillins Other (See Comments)    Unknown reaction Unknown as child     History reviewed. No pertinent past medical history.  History reviewed. No pertinent family history.  Social History   Socioeconomic History  . Marital status: Married    Spouse name: Not on file  . Number of children: Not on file  . Years of education: Not on file  . Highest education level: Not on file  Occupational History  . Not on file  Tobacco Use  . Smoking status: Former Games developer  . Smokeless tobacco: Never Used   Vaping Use  . Vaping Use: Never used  Substance and Sexual Activity  . Alcohol use: Not Currently  . Drug use: Never  . Sexual activity: Not on file  Other Topics Concern  . Not on file  Social History Narrative  . Not on file   Social Determinants of Health   Financial Resource Strain:   . Difficulty of Paying Living Expenses: Not on file  Food Insecurity:   . Worried About Programme researcher, broadcasting/film/video in the Last Year: Not on file  . Ran Out of Food in the Last Year: Not on file  Transportation Needs:   . Lack of Transportation (Medical): Not on file  . Lack of Transportation (Non-Medical): Not on file  Physical Activity:   . Days of Exercise per Week: Not on file  . Minutes of Exercise per Session: Not on file  Stress:   . Feeling of Stress : Not on file  Social Connections:   . Frequency of Communication with Friends and Family: Not on file  . Frequency of Social Gatherings with Friends and Family: Not on file  . Attends Religious Services: Not on file  . Active Member of Clubs or Organizations: Not on file  . Attends Banker Meetings: Not on file  . Marital Status: Not on file  Intimate Partner Violence:   . Fear of Current or Ex-Partner: Not on file  . Emotionally Abused: Not on file  . Physically Abused: Not on file  . Sexually Abused: Not on file    Past Medical History, Surgical history, Social history, and Family history were reviewed and updated as appropriate.   Please see review of systems for further details on the patient's review from today.   Objective:   Physical Exam:  There were no vitals taken for this visit.  Physical Exam Constitutional:      General: He is not in acute distress. Musculoskeletal:        General: No deformity.  Neurological:     Mental Status: He is alert and oriented to person, place, and time.     Coordination: Coordination normal.  Psychiatric:        Attention and Perception: Attention and perception normal. He  does not perceive auditory or visual hallucinations.        Mood and Affect: Mood is anxious and depressed. Affect is not labile, blunt, angry or inappropriate.        Speech: Speech normal.        Behavior: Behavior is slowed and withdrawn. Behavior is cooperative.        Thought Content: Thought content normal. Thought content is not paranoid or delusional. Thought content does not include homicidal or suicidal ideation. Thought content does not include homicidal or suicidal plan.        Cognition and Memory: Cognition and memory normal.  Judgment: Judgment normal.     Comments: Insight intact     Lab Review:  No results found for: NA, K, CL, CO2, GLUCOSE, BUN, CREATININE, CALCIUM, PROT, ALBUMIN, AST, ALT, ALKPHOS, BILITOT, GFRNONAA, GFRAA  No results found for: WBC, RBC, HGB, HCT, PLT, MCV, MCH, MCHC, RDW, LYMPHSABS, MONOABS, EOSABS, BASOSABS  No results found for: POCLITH, LITHIUM   No results found for: PHENYTOIN, PHENOBARB, VALPROATE, CBMZ   .res Assessment: Plan:   Recommend continuation of leave of absence from work. Agree with Dr. Marlyne Beards with projected return to work date of 01/02/2021.  Discussed option of decreasing Seroquel to possibly minimize daytime somnolence, however patient reports that benefits of Seroquel in terms of improved sleep outweigh side effects at this time.  We will therefore continue Seroquel 100 mg at bedtime for insomnia.  Discussed that Seroquel is being prescribed for off label indication for insomnia at low dose and that Seroquel may also be helpful for his anxiety. Continue Wellbutrin XL 450 mg daily for depression. Continue Viibryd 40 mg daily for anxiety and depression. Continue Ambien 15 mg at bedtime for insomnia.  Discussed taking Ambien immediately prior to bedtime. Recommend continuing psychotherapy for treatment of anxiety and depression. Patient to follow-up in 4 weeks or sooner if clinically indicated. Patient advised to contact office  with any questions, adverse effects, or acute worsening in signs and symptoms.    [place] was seen today for anxiety, depression and insomnia.  Diagnoses and all orders for this visit:  Severe major depression without psychotic features (HCC)  GAD (generalized anxiety disorder)     Please see After Visit Summary for patient specific instructions.  Future Appointments  Date Time Provider Department Center  11/10/2020  2:30 PM Corie Chiquito, PMHNP [facility] None    No orders of the defined types were placed in this encounter.   -------------------------------

## 2020-10-11 DIAGNOSIS — Z0289 Encounter for other administrative examinations: Secondary | ICD-10-CM

## 2020-10-20 ENCOUNTER — Encounter: Payer: Self-pay | Admitting: Psychiatry

## 2020-10-25 ENCOUNTER — Other Ambulatory Visit: Payer: Self-pay | Admitting: Psychiatry

## 2020-10-25 DIAGNOSIS — F411 Generalized anxiety disorder: Secondary | ICD-10-CM

## 2020-10-25 DIAGNOSIS — F322 Major depressive disorder, single episode, severe without psychotic features: Secondary | ICD-10-CM

## 2020-10-25 DIAGNOSIS — G4726 Circadian rhythm sleep disorder, shift work type: Secondary | ICD-10-CM

## 2020-10-26 ENCOUNTER — Other Ambulatory Visit: Payer: Self-pay | Admitting: Psychiatry

## 2020-10-26 DIAGNOSIS — G4726 Circadian rhythm sleep disorder, shift work type: Secondary | ICD-10-CM

## 2020-10-26 DIAGNOSIS — F411 Generalized anxiety disorder: Secondary | ICD-10-CM

## 2020-10-26 NOTE — Telephone Encounter (Signed)
Next visit 11/10/20

## 2020-11-09 ENCOUNTER — Telehealth: Payer: Self-pay | Admitting: Psychiatry

## 2020-11-09 NOTE — Telephone Encounter (Signed)
Received fax from [employer] for Behavioral Health Disability Assessment Form.

## 2020-11-10 ENCOUNTER — Ambulatory Visit: Payer: 59 | Admitting: Psychiatry

## 2020-11-10 ENCOUNTER — Ambulatory Visit (INDEPENDENT_AMBULATORY_CARE_PROVIDER_SITE_OTHER): Payer: 59 | Admitting: Psychiatry

## 2020-11-10 ENCOUNTER — Other Ambulatory Visit: Payer: Self-pay

## 2020-11-10 ENCOUNTER — Encounter: Payer: Self-pay | Admitting: Psychiatry

## 2020-11-10 DIAGNOSIS — F411 Generalized anxiety disorder: Secondary | ICD-10-CM | POA: Diagnosis not present

## 2020-11-10 DIAGNOSIS — G4726 Circadian rhythm sleep disorder, shift work type: Secondary | ICD-10-CM

## 2020-11-10 DIAGNOSIS — F3342 Major depressive disorder, recurrent, in full remission: Secondary | ICD-10-CM

## 2020-11-10 DIAGNOSIS — F322 Major depressive disorder, single episode, severe without psychotic features: Secondary | ICD-10-CM | POA: Diagnosis not present

## 2020-11-10 MED ORDER — ZOLPIDEM TARTRATE 10 MG PO TABS: ORAL_TABLET | ORAL | 1 refills | Status: DC

## 2020-11-10 MED ORDER — LITHIUM CARBONATE 150 MG PO CAPS: ORAL_CAPSULE | ORAL | 1 refills | Status: DC

## 2020-11-10 MED ORDER — QUETIAPINE FUMARATE 100 MG PO TABS: ORAL_TABLET | ORAL | 1 refills | Status: DC

## 2020-11-10 MED ORDER — VIIBRYD 40 MG PO TABS: 40.0000 mg | ORAL_TABLET | Freq: Every day | ORAL | 1 refills | Status: DC

## 2020-11-10 NOTE — Progress Notes (Signed)
   11/10/20 1449  Facial and Oral Movements  Muscles of Facial Expression 0  Lips and Perioral Area 0  Jaw 0  Tongue 0  Extremity Movements  Upper (arms, wrists, hands, fingers) 0  Lower (legs, knees, ankles, toes) 0  Trunk Movements  Neck, shoulders, hips 0  Overall Severity  Severity of abnormal movements (highest score from questions above) 0  Incapacitation due to abnormal movements 0  Patient's awareness of abnormal movements (rate only patient's report) 0  AIMS Total Score  AIMS Total Score 0

## 2020-11-10 NOTE — Progress Notes (Deleted)
   11/10/20 1449  Facial and Oral Movements  Muscles of Facial Expression 0  Lips and Perioral Area 0  Jaw 0  Tongue 0  Extremity Movements  Upper (arms, wrists, hands, fingers) 0  Lower (legs, knees, ankles, toes) 0  Trunk Movements  Neck, shoulders, hips 0  Overall Severity  Severity of abnormal movements (highest score from questions above) 0  Incapacitation due to abnormal movements 0  Patient's awareness of abnormal movements (rate only patient's report) 0  AIMS Total Score  AIMS Total Score 0   

## 2020-11-10 NOTE — Progress Notes (Signed)
Alejandro Fitzgerald [id number] 16-Nov-1966 54 y.o.  Subjective:   Patient ID:  Alejandro Fitzgerald is a 54 y.o. (DOB 18-May-1966) male.  Chief Complaint:  Chief Complaint  Patient presents with  . Anxiety  . Depression  . Insomnia    HPI Alejandro Fitzgerald presents to the office today for follow-up of depression, anxiety, and insomnia.   He reports that he is "about the same." Anxiety has been "up and down." Reports that his mood is also "up and down." He reports persistent sad mood. Denies excessive irritability. Physically limited due to knee pain and reports that he mostly sits and watches TV. Energy and motivation have been low. He reports that he has difficulty falling asleep and sometimes does not fall asleep until about 4-5 am. He reports that he has gained weight with medication and appetite has been increased. Concentration is fair and reports that he relies on sister to remind him of things. He reports that he has sister help him with different correspondence. Minimal enjoyment in things.   He reports that he has occasional suicidal thoughts. Reports that he no longer has firearms in his home. He reports that he reaches out to his sister and father when this occurs. He contracts for safety. He reports that he has had suicidal thoughts since wife left.   Has apt tomorrow with his attorney to finalize divorce. He reports that he is also dealing with civil suit that was initiated by his wife that is interfering with visitation with his daughter and has to have supervised visitation with son. He reports significant financial stress.   Lost his dog of 12 years in April.   Reports seeing therapist regularly.  AIMS     Office Visit from 11/10/2020 in [facility]  AIMS Total Score 0       Review of Systems:  Review of Systems  Gastrointestinal: Negative.   Musculoskeletal: Positive for neck pain. Negative for gait problem.       Knee pain  Neurological: Negative for tremors.   Psychiatric/Behavioral:       Please refer to HPI    Medications: I have reviewed the patient's current medications.  Current Outpatient Medications  Medication Sig Dispense Refill  . buPROPion (WELLBUTRIN XL) 150 MG 24 hr tablet TAKE 3 TABLETS(450 MG) BY MOUTH DAILY 270 tablet 1  . esomeprazole (NEXIUM) 40 MG capsule Take by mouth.    . loratadine (CLARITIN) 10 MG tablet Take by mouth.    . meloxicam (MOBIC) 15 MG tablet Take 15 mg by mouth at bedtime.     . metoprolol succinate (TOPROL-XL) 50 MG 24 hr tablet Take 1 tablet by mouth daily.    . QUEtiapine (SEROQUEL) 100 MG tablet TAKE 1 TABLET(100 MG) BY MOUTH AT BEDTIME 30 tablet 1  . simvastatin (ZOCOR) 20 MG tablet Take by mouth.    . Vilazodone HCl (VIIBRYD) 40 MG TABS Take 1 tablet (40 mg total) by mouth daily. 90 tablet 1  . [START ON 11/23/2020] zolpidem (AMBIEN) 10 MG tablet TAKE 1 AND 1/2 TABLETS(15 MG) BY MOUTH AT BEDTIME 45 tablet 1  . lithium carbonate 150 MG capsule Take 1 capsule po QHS x 3-5 days, then increase to 2 capsules po QHS 60 capsule 1   No current facility-administered medications for this visit.    Medication Side Effects: Other: Increased appetite, wt gain  Allergies:  Allergies  Allergen Reactions  . Penicillins Other (See Comments)    Unknown reaction Unknown as  child     History reviewed. No pertinent past medical history.  History reviewed. No pertinent family history.  Social History   Socioeconomic History  . Marital status: Married    Spouse name: Not on file  . Number of children: Not on file  . Years of education: Not on file  . Highest education level: Not on file  Occupational History  . Not on file  Tobacco Use  . Smoking status: Former Games developer  . Smokeless tobacco: Never Used  Vaping Use  . Vaping Use: Never used  Substance and Sexual Activity  . Alcohol use: Not Currently  . Drug use: Never  . Sexual activity: Not on file  Other Topics Concern  . Not on file  Social  History Narrative  . Not on file   Social Determinants of Health   Financial Resource Strain:   . Difficulty of Paying Living Expenses: Not on file  Food Insecurity:   . Worried About Programme researcher, broadcasting/film/video in the Last Year: Not on file  . Ran Out of Food in the Last Year: Not on file  Transportation Needs:   . Lack of Transportation (Medical): Not on file  . Lack of Transportation (Non-Medical): Not on file  Physical Activity:   . Days of Exercise per Week: Not on file  . Minutes of Exercise per Session: Not on file  Stress:   . Feeling of Stress : Not on file  Social Connections:   . Frequency of Communication with Friends and Family: Not on file  . Frequency of Social Gatherings with Friends and Family: Not on file  . Attends Religious Services: Not on file  . Active Member of Clubs or Organizations: Not on file  . Attends Banker Meetings: Not on file  . Marital Status: Not on file  Intimate Partner Violence:   . Fear of Current or Ex-Partner: Not on file  . Emotionally Abused: Not on file  . Physically Abused: Not on file  . Sexually Abused: Not on file    Past Medical History, Surgical history, Social history, and Family history were reviewed and updated as appropriate.   Please see review of systems for further details on the patient's review from today.   Objective:   Physical Exam:  There were no vitals taken for this visit.  Physical Exam Constitutional:      General: He is not in acute distress. Musculoskeletal:        General: No deformity.  Neurological:     Mental Status: He is alert and oriented to person, place, and time.     Coordination: Coordination normal.  Psychiatric:        Attention and Perception: Attention and perception normal. He does not perceive auditory or visual hallucinations.        Mood and Affect: Mood is anxious and depressed. Affect is not labile, blunt, angry or inappropriate.        Speech: Speech normal.         Behavior: Behavior normal.        Thought Content: Thought content is not paranoid or delusional. Thought content does not include homicidal or suicidal ideation. Thought content does not include homicidal or suicidal plan.        Cognition and Memory: Cognition and memory normal.        Judgment: Judgment normal.     Comments: Insight intact     Lab Review:  No results found for: NA, K, CL,  CO2, GLUCOSE, BUN, CREATININE, CALCIUM, PROT, ALBUMIN, AST, ALT, ALKPHOS, BILITOT, GFRNONAA, GFRAA  No results found for: WBC, RBC, HGB, HCT, PLT, MCV, MCH, MCHC, RDW, LYMPHSABS, MONOABS, EOSABS, BASOSABS  No results found for: POCLITH, LITHIUM   No results found for: PHENYTOIN, PHENOBARB, VALPROATE, CBMZ   .res Assessment: Plan:   Recommend continuing leave through 01/02/2021.  Discussed potential benefits, risks, and side effects of Lithium. Discussed that studies indicate that Lithium can be helpful for suicidal thoughts. Discussed that lithium for treatment of depression without bipolar disorder is an off-label use. Pt agrees to trial of Lithium. Will start Lithium 150 mg po QHS for 3-5 days, then increase to 300 mg po QHS for depression and suicidal thoughts. Pt contracts for safety and and denies SI at time of exam.  Continue Wellbutrin XL 150 mg po qd for depression.  Continue Seroquel 100 mg po QHS for insomnia. Continue Viibryd 40 mg po qd for anxiety and depression.  Continue Ambien 10 mg po QHS for insomnia.  Pt to follow-up in 4 weeks or sooner if clinically indicated.  Patient advised to contact office with any questions, adverse effects, or acute worsening in signs and symptoms.   Alejandro Fitzgerald was seen today for anxiety, depression and insomnia.  Diagnoses and all orders for this visit:  Severe major depression without psychotic features (HCC) -     lithium carbonate 150 MG capsule; Take 1 capsule po QHS x 3-5 days, then increase to 2 capsules po QHS -     QUEtiapine (SEROQUEL) 100 MG  tablet; TAKE 1 TABLET(100 MG) BY MOUTH AT BEDTIME  GAD (generalized anxiety disorder) -     QUEtiapine (SEROQUEL) 100 MG tablet; TAKE 1 TABLET(100 MG) BY MOUTH AT BEDTIME -     Vilazodone HCl (VIIBRYD) 40 MG TABS; Take 1 tablet (40 mg total) by mouth daily. -     zolpidem (AMBIEN) 10 MG tablet; TAKE 1 AND 1/2 TABLETS(15 MG) BY MOUTH AT BEDTIME  Circadian rhythm sleep disorder, shift work type -     QUEtiapine (SEROQUEL) 100 MG tablet; TAKE 1 TABLET(100 MG) BY MOUTH AT BEDTIME -     zolpidem (AMBIEN) 10 MG tablet; TAKE 1 AND 1/2 TABLETS(15 MG) BY MOUTH AT BEDTIME  MDD (major depressive disorder), recurrent, in full remission (HCC) -     Vilazodone HCl (VIIBRYD) 40 MG TABS; Take 1 tablet (40 mg total) by mouth daily.     Please see After Visit Summary for patient specific instructions.  Future Appointments  Date Time Provider Department Center  12/28/2020 12:00 PM Corie Chiquito, PMHNP [facility] None    No orders of the defined types were placed in this encounter.   -------------------------------

## 2020-11-14 DIAGNOSIS — Z0289 Encounter for other administrative examinations: Secondary | ICD-10-CM

## 2020-11-14 NOTE — Telephone Encounter (Signed)
Form completed and given to [name] to review and sign 

## 2020-12-16 ENCOUNTER — Other Ambulatory Visit: Payer: Self-pay | Admitting: Psychiatry

## 2020-12-16 DIAGNOSIS — G4726 Circadian rhythm sleep disorder, shift work type: Secondary | ICD-10-CM

## 2020-12-16 DIAGNOSIS — F322 Major depressive disorder, single episode, severe without psychotic features: Secondary | ICD-10-CM

## 2020-12-16 DIAGNOSIS — F411 Generalized anxiety disorder: Secondary | ICD-10-CM

## 2020-12-16 NOTE — Telephone Encounter (Signed)
Pharmacy requests Quetiapine 100 mg nightly sent as #30 no refill as last escription at appt 11/10/2020 labelled by Epic as sent to pharmacy but receipt not listed.Next appt 12/28/20.

## 2020-12-28 ENCOUNTER — Ambulatory Visit (INDEPENDENT_AMBULATORY_CARE_PROVIDER_SITE_OTHER): Payer: 59 | Admitting: Psychiatry

## 2020-12-28 ENCOUNTER — Other Ambulatory Visit: Payer: Self-pay

## 2020-12-28 ENCOUNTER — Encounter: Payer: Self-pay | Admitting: Psychiatry

## 2020-12-28 DIAGNOSIS — F411 Generalized anxiety disorder: Secondary | ICD-10-CM

## 2020-12-28 DIAGNOSIS — F322 Major depressive disorder, single episode, severe without psychotic features: Secondary | ICD-10-CM

## 2020-12-28 DIAGNOSIS — G4726 Circadian rhythm sleep disorder, shift work type: Secondary | ICD-10-CM

## 2020-12-28 MED ORDER — QUETIAPINE FUMARATE 100 MG PO TABS: ORAL_TABLET | ORAL | 1 refills | Status: DC

## 2020-12-28 MED ORDER — LITHIUM CARBONATE 150 MG PO CAPS: 450.0000 mg | ORAL_CAPSULE | Freq: Every day | ORAL | 0 refills | Status: DC

## 2020-12-28 NOTE — Progress Notes (Signed)
Alejandro Fitzgerald [id number] 08-Sep-1966 54 y.o.  Subjective:   Patient ID:  Alejandro Fitzgerald is a 54 y.o. (DOB 03/26/1966) male.  Chief Complaint:  Chief Complaint  Patient presents with  . Depression  . Anxiety  . Insomnia    HPI Alejandro Fitzgerald presents to the office today for follow-up of depression, anxiety, and insomnia. He reports that Lithium has been causing some metallic taste. He reports that Lithium has helped some with sleep. He reports, "it might have calmed me down some." He reports that he continues to have periods of sadness when missing his children and thinking about not being able to be with them all the time. He reports some irritability and frustration. He reports, "I'm always worrying about something." Denies any panic attacks recently. Energy and motivation have been ok and was able to play with son.  He reports poor concentration and has been pre-occupied with current stressors. He reports that some nights he is awake until 3-4 am. Sleeping 3-7 hours a night. Appetite has been increased. Enjoying time with his son.   He reports daily SI. Denies access to weapons. He reports that SI has decreased in frequency since starting Lithium. Contracts for safety. Reports that son is reason not to act on suicidal thoughts.   He reports that divorce has been finalized. Has not been to court re: child custody. Has been able to see his son periodically. Has not been able to see his daughter. Holidays went ok. Has been spending time with sister and father and this has been helpful.   He reports that insurance may not cover Ambien 15 mg in the new year and may need to switch to Ambien CR 12.5 mg.   AIMS   Flowsheet Row Office Visit from 11/10/2020 in [facility]  AIMS Total Score 0       Review of Systems:  Review of Systems  Gastrointestinal: Negative.   Musculoskeletal: Negative for gait problem.       Knee pain. Reports injections have helped with pain.    Neurological: Negative for tremors.  Psychiatric/Behavioral:       Please refer to HPI    Medications: I have reviewed the patient's current medications.  Current Outpatient Medications  Medication Sig Dispense Refill  . buPROPion (WELLBUTRIN XL) 150 MG 24 hr tablet TAKE 3 TABLETS(450 MG) BY MOUTH DAILY 270 tablet 1  . esomeprazole (NEXIUM) 40 MG capsule Take by mouth.    . loratadine (CLARITIN) 10 MG tablet Take by mouth.    . meloxicam (MOBIC) 15 MG tablet Take 15 mg by mouth at bedtime.     . metoprolol succinate (TOPROL-XL) 50 MG 24 hr tablet Take 1 tablet by mouth daily.    . simvastatin (ZOCOR) 20 MG tablet Take by mouth.    . Vilazodone HCl (VIIBRYD) 40 MG TABS Take 1 tablet (40 mg total) by mouth daily. 90 tablet 1  . zolpidem (AMBIEN) 10 MG tablet TAKE 1 AND 1/2 TABLETS(15 MG) BY MOUTH AT BEDTIME 45 tablet 1  . lithium carbonate 150 MG capsule Take 3 capsules (450 mg total) by mouth at bedtime. 270 capsule 0  . QUEtiapine (SEROQUEL) 100 MG tablet TAKE 1 TABLET(100 MG) BY MOUTH AT BEDTIME 30 tablet 1   No current facility-administered medications for this visit.    Medication Side Effects: Other: Increased appetite, metallic taste  Allergies:  Allergies  Allergen Reactions  . Penicillins Other (See Comments)    Unknown reaction Unknown as  child     History reviewed. No pertinent past medical history.  History reviewed. No pertinent family history.  Social History   Socioeconomic History  . Marital status: Married    Spouse name: Not on file  . Number of children: Not on file  . Years of education: Not on file  . Highest education level: Not on file  Occupational History  . Not on file  Tobacco Use  . Smoking status: Former Games developer  . Smokeless tobacco: Never Used  Vaping Use  . Vaping Use: Never used  Substance and Sexual Activity  . Alcohol use: Not Currently  . Drug use: Never  . Sexual activity: Not on file  Other Topics Concern  . Not on file   Social History Narrative  . Not on file   Social Determinants of Health   Financial Resource Strain: Not on file  Food Insecurity: Not on file  Transportation Needs: Not on file  Physical Activity: Not on file  Stress: Not on file  Social Connections: Not on file  Intimate Partner Violence: Not on file    Past Medical History, Surgical history, Social history, and Family history were reviewed and updated as appropriate.   Please see review of systems for further details on the patient's review from today.   Objective:   Physical Exam:  There were no vitals taken for this visit.  Physical Exam Constitutional:      General: He is not in acute distress. Musculoskeletal:        General: No deformity.  Neurological:     Mental Status: He is alert and oriented to person, place, and time.     Coordination: Coordination normal.  Psychiatric:        Attention and Perception: Attention and perception normal. He does not perceive auditory or visual hallucinations.        Mood and Affect: Mood is anxious and depressed. Affect is blunt. Affect is not labile, angry or inappropriate.        Speech: Speech normal.        Behavior: Behavior normal.        Thought Content: Thought content normal. Thought content is not paranoid or delusional. Thought content does not include homicidal or suicidal ideation. Thought content does not include homicidal or suicidal plan.        Cognition and Memory: Cognition and memory normal.        Judgment: Judgment normal.     Comments: Insight intact Mood presents as slightly less depressed (affect brightens when talking about spending time with son, somewhat forward thinking, more positive thought content-ie., expresses gratitude for support from family)     Lab Review:  No results found for: NA, K, CL, CO2, GLUCOSE, BUN, CREATININE, CALCIUM, PROT, ALBUMIN, AST, ALT, ALKPHOS, BILITOT, GFRNONAA, GFRAA  No results found for: WBC, RBC, HGB, HCT, PLT,  MCV, MCH, MCHC, RDW, LYMPHSABS, MONOABS, EOSABS, BASOSABS  No results found for: POCLITH, LITHIUM   No results found for: PHENYTOIN, PHENOBARB, VALPROATE, CBMZ   .res Assessment: Plan:   Pt seen for 30 minutes and time spent counseling pt regarding treatment options to include potential benefits, risks, and side effects of increasing Lithium to 450 mg po QHS since he has experienced some partial improvement in depressive s/s and chronic suicidal thoughts with lower dose and may experience increased benefit with higher dose. Discussed that he can decrease Lithium 150 mg to 2 capsules if intolerable side effects occur with 3 capsules po QHS.  Pt agrees to increase in Lithium.  Will continue Ambien 15 mg po QHS for insomnia. Will consider switching to Ambien CR 12.5 mg po QHS if Ambien 15 mg po QHS is no longer covered by insurance after start of new benefit year.  Continue Wellbutrin XL 450 mg po qd for depression.  Continue Seroquel 100 mg po QHS for off-label indications of insomnia and anxiety.  Continue Viibryd 40 mg po qd for depression and anxiety.  Pt to follow-up in 6 weeks or sooner if clinically indicated.  Recommend extending work leave through 02/21/21 with tentative return to work date of 02/22/21. Patient advised to contact office with any questions, adverse effects, or acute worsening in signs and symptoms.  Zavon was seen today for depression, anxiety and insomnia.  Diagnoses and all orders for this visit:  GAD (generalized anxiety disorder) -     QUEtiapine (SEROQUEL) 100 MG tablet; TAKE 1 TABLET(100 MG) BY MOUTH AT BEDTIME  Severe major depression without psychotic features (HCC) -     lithium carbonate 150 MG capsule; Take 3 capsules (450 mg total) by mouth at bedtime. -     QUEtiapine (SEROQUEL) 100 MG tablet; TAKE 1 TABLET(100 MG) BY MOUTH AT BEDTIME  Circadian rhythm sleep disorder, shift work type -     QUEtiapine (SEROQUEL) 100 MG tablet; TAKE 1 TABLET(100 MG) BY MOUTH  AT BEDTIME     Please see After Visit Summary for patient specific instructions.  Future Appointments  Date Time Provider Department Center  02/14/2021  4:00 PM Corie Chiquito, PMHNP [facility] None    No orders of the defined types were placed in this encounter.   -------------------------------

## 2021-01-03 ENCOUNTER — Telehealth: Payer: Self-pay | Admitting: Psychiatry

## 2021-01-03 DIAGNOSIS — G4726 Circadian rhythm sleep disorder, shift work type: Secondary | ICD-10-CM

## 2021-01-03 DIAGNOSIS — G4700 Insomnia, unspecified: Secondary | ICD-10-CM

## 2021-01-03 NOTE — Telephone Encounter (Signed)
Has an appt on 02/14/21. Alejandro Fitzgerald takes Ambien 10 mg and needs a refill called in to [facility], Store [phone]. Phone # [phone].Wake's insurance is [employer] and his insurance company has requested that we call them to authorize he is allowed 45 pills a month. The provider line is [phone].

## 2021-01-04 NOTE — Telephone Encounter (Signed)
I'm already working on his prior authorization. Received 01/04/2020 from pharmacy

## 2021-01-04 NOTE — Telephone Encounter (Signed)
Prior authorization DENIED but no information given in my e mail. The fax may have more information. They may require he try their formularies listed. Will follow up once received fax.

## 2021-01-05 NOTE — Telephone Encounter (Signed)
Pt called to say the insurance he now has will not cover the Ambien 10mg  1 1/2 #45. He asks that we call in an RX for Ambien 12.5mg  instead. To [facility] on [address]

## 2021-01-06 MED ORDER — ZOLPIDEM TARTRATE ER 12.5 MG PO TBCR: 12.5000 mg | EXTENDED_RELEASE_TABLET | Freq: Every evening | ORAL | 0 refills | Status: DC | PRN

## 2021-01-27 ENCOUNTER — Telehealth: Payer: Self-pay | Admitting: Psychiatry

## 2021-01-27 NOTE — Telephone Encounter (Signed)
Received fax from [facility] regarding [employer]. Requesting completion of Health Care Provider Statement.

## 2021-01-27 NOTE — Telephone Encounter (Signed)
Noted and picked up today to work on

## 2021-01-30 ENCOUNTER — Other Ambulatory Visit: Payer: Self-pay

## 2021-01-30 DIAGNOSIS — G4700 Insomnia, unspecified: Secondary | ICD-10-CM

## 2021-01-30 MED ORDER — ZOLPIDEM TARTRATE ER 12.5 MG PO TBCR: 12.5000 mg | EXTENDED_RELEASE_TABLET | Freq: Every evening | ORAL | 0 refills | Status: DC | PRN

## 2021-02-06 ENCOUNTER — Telehealth: Payer: Self-pay | Admitting: Psychiatry

## 2021-02-06 DIAGNOSIS — Z0289 Encounter for other administrative examinations: Secondary | ICD-10-CM

## 2021-02-07 ENCOUNTER — Ambulatory Visit (INDEPENDENT_AMBULATORY_CARE_PROVIDER_SITE_OTHER): Payer: 59 | Admitting: Psychiatry

## 2021-02-07 ENCOUNTER — Encounter: Payer: Self-pay | Admitting: Psychiatry

## 2021-02-07 ENCOUNTER — Other Ambulatory Visit: Payer: Self-pay

## 2021-02-07 DIAGNOSIS — F411 Generalized anxiety disorder: Secondary | ICD-10-CM

## 2021-02-07 DIAGNOSIS — G4700 Insomnia, unspecified: Secondary | ICD-10-CM

## 2021-02-07 DIAGNOSIS — F3342 Major depressive disorder, recurrent, in full remission: Secondary | ICD-10-CM

## 2021-02-07 DIAGNOSIS — F322 Major depressive disorder, single episode, severe without psychotic features: Secondary | ICD-10-CM | POA: Diagnosis not present

## 2021-02-07 DIAGNOSIS — G4726 Circadian rhythm sleep disorder, shift work type: Secondary | ICD-10-CM

## 2021-02-07 MED ORDER — QUETIAPINE FUMARATE 50 MG PO TABS: ORAL_TABLET | ORAL | 0 refills | Status: DC

## 2021-02-07 MED ORDER — ARIPIPRAZOLE 5 MG PO TABS: ORAL_TABLET | ORAL | 2 refills | Status: DC

## 2021-02-07 MED ORDER — LITHIUM CARBONATE 150 MG PO CAPS: 300.0000 mg | ORAL_CAPSULE | Freq: Every day | ORAL | 0 refills | Status: DC

## 2021-02-07 MED ORDER — ZOLPIDEM TARTRATE ER 12.5 MG PO TBCR: 12.5000 mg | EXTENDED_RELEASE_TABLET | Freq: Every evening | ORAL | 2 refills | Status: DC | PRN

## 2021-02-07 NOTE — Progress Notes (Signed)
Alejandro Fitzgerald [id number] 1966/01/05 55 y.o.  Subjective:   Patient ID:  Alejandro Fitzgerald is a 55 y.o. (DOB 1966/12/21) male.  Chief Complaint:  Chief Complaint  Patient presents with  . Depression  . Anxiety  . Insomnia    HPI Alejandro Fitzgerald presents to the office today for follow-up of depression, anxiety, and insomnia.  He reports that he continues to have anxiety in response to pending court dates regarding charges about allegations and also court regarding custody. He reports that he has been "up and down." He reports that "things will hit me at times,"  such missing his children. He reports occasional irritability and can be "snappy" at times. Has not had any recent panic attacks. Continued worry and anxious thoughts.   He has not seen any significant changes with increase in Lithium. He reports that Seroquel has been helpful for his sleep but causes severe nasal congestion. He reports that pharmacy has not filled Ambien CR and says "action needed."  He reports that Ambien CR is about as effective as Ambien IR. Slept about 5 hours a night. Has been trying to keep busy with cooking. He reports that appetite is decreased during the day and eats more at night. He reports poor concentration and feels "spacey."   He reports chronic SI "but I shake myself out of it" and thinks about reasons for living, such as his family.   Past Psychiatric Medication Trials: Seroquel- Helpful for insomnia Wellbutrin XL  Viibryd  Ambien Ambien CR Lithium   AIMS   Flowsheet Row Office Visit from 11/10/2020 in [facility]  AIMS Total Score 0       Review of Systems:  Review of Systems  Gastrointestinal: Negative.   Musculoskeletal: Negative for gait problem.       Recent strained hamstring  Neurological: Negative for tremors.  Psychiatric/Behavioral:       Please refer to HPI   Injured hamstring with missing a step going down stairs.  Medications: I have reviewed the  patient's current medications.  Current Outpatient Medications  Medication Sig Dispense Refill  . ARIPiprazole (ABILIFY) 5 MG tablet Take 1/2-1 tablet daily 30 tablet 2  . buPROPion (WELLBUTRIN XL) 150 MG 24 hr tablet TAKE 3 TABLETS(450 MG) BY MOUTH DAILY 270 tablet 1  . diphenhydrAMINE HCl, Sleep, 50 MG CAPS Take 50 mg by mouth at bedtime as needed.    Marland Kitchen esomeprazole (NEXIUM) 40 MG capsule Take by mouth.    . loratadine (CLARITIN) 10 MG tablet Take by mouth.    . meloxicam (MOBIC) 15 MG tablet Take 15 mg by mouth at bedtime.     . metoprolol succinate (TOPROL-XL) 50 MG 24 hr tablet Take 1 tablet by mouth daily.    . simvastatin (ZOCOR) 20 MG tablet Take by mouth.    . Vilazodone HCl (VIIBRYD) 40 MG TABS Take 1 tablet (40 mg total) by mouth daily. 90 tablet 1  . lithium carbonate 150 MG capsule Take 2 capsules (300 mg total) by mouth at bedtime. 180 capsule 0  . QUEtiapine (SEROQUEL) 50 MG tablet TAKE 1-2 TABLETS BY MOUTH AT BEDTIME 180 tablet 0  . zolpidem (AMBIEN CR) 12.5 MG CR tablet Take 1 tablet (12.5 mg total) by mouth at bedtime as needed for sleep. 30 tablet 2   No current facility-administered medications for this visit.    Medication Side Effects: Other: Nasal congestion with Seroquel  Allergies:  Allergies  Allergen Reactions  . Penicillins Other (See  Comments)    Unknown reaction Unknown as child     History reviewed. No pertinent past medical history.  History reviewed. No pertinent family history.  Social History   Socioeconomic History  . Marital status: Married    Spouse name: Not on file  . Number of children: Not on file  . Years of education: Not on file  . Highest education level: Not on file  Occupational History  . Not on file  Tobacco Use  . Smoking status: Former Games developer  . Smokeless tobacco: Never Used  Vaping Use  . Vaping Use: Never used  Substance and Sexual Activity  . Alcohol use: Not Currently  . Drug use: Never  . Sexual activity:  Not on file  Other Topics Concern  . Not on file  Social History Narrative  . Not on file   Social Determinants of Health   Financial Resource Strain: Not on file  Food Insecurity: Not on file  Transportation Needs: Not on file  Physical Activity: Not on file  Stress: Not on file  Social Connections: Not on file  Intimate Partner Violence: Not on file    Past Medical History, Surgical history, Social history, and Family history were reviewed and updated as appropriate.   Please see review of systems for further details on the patient's review from today.   Objective:   Physical Exam:  There were no vitals taken for this visit.  Physical Exam Constitutional:      General: He is not in acute distress. Musculoskeletal:        General: No deformity.  Neurological:     Mental Status: He is alert and oriented to person, place, and time.     Coordination: Coordination normal.  Psychiatric:        Attention and Perception: Attention and perception normal. He does not perceive auditory or visual hallucinations.        Mood and Affect: Mood is anxious and depressed. Affect is not labile, blunt, angry or inappropriate.        Speech: Speech normal.        Behavior: Behavior is cooperative.        Thought Content: Thought content normal. Thought content is not paranoid or delusional. Thought content does not include homicidal or suicidal ideation. Thought content does not include homicidal or suicidal plan.        Cognition and Memory: Cognition and memory normal.        Judgment: Judgment normal.     Comments: Insight intact     Lab Review:  No results found for: NA, K, CL, CO2, GLUCOSE, BUN, CREATININE, CALCIUM, PROT, ALBUMIN, AST, ALT, ALKPHOS, BILITOT, GFRNONAA, GFRAA  No results found for: WBC, RBC, HGB, HCT, PLT, MCV, MCH, MCHC, RDW, LYMPHSABS, MONOABS, EOSABS, BASOSABS  No results found for: POCLITH, LITHIUM   No results found for: PHENYTOIN, PHENOBARB, VALPROATE, CBMZ    .res Assessment: Plan:   Recommend extending leave of absence from work through 04/09/21 with tentative return to work date of 04/10/21. Discussed potential benefits, risks, and side effects of Abilify. Discussed potential metabolic side effects associated with atypical antipsychotics, as well as potential risk for movement side effects. Advised pt to contact office if movement side effects occur. Pt agrees to trial of Abilify. Will start Abilify 5 mg 1/2-1 tab po qd for augmentation of depression.  Will decrease Lithium from 450 mg to 300 mg since there was no significant improvement in mood or suicidal thoughts at 450  mg dose. Will change Seroquel to 50 mg 1-2 tabs at bedtime for insomnia since patient reports that he has severe nasal congestion with Seroquel.  Discussed that nasal congestion can be a dose-related side effect and may improve with lower dose.  Discussed that lower doses of Seroquel are typically more sedating as well. Continue Wellbutrin XL 450 mg daily for depression. Continue Viibryd 40 mg daily for anxiety depression. Continue Ambien CR 12.5 mg at bedtime for insomnia. Patient to follow-up in 6 to 8 weeks or sooner if clinically indicated. Patient advised to contact office with any questions, adverse effects, or acute worsening in signs and symptoms.     Alejandro Fitzgerald was seen today for depression, anxiety and insomnia.  Diagnoses and all orders for this visit:  GAD (generalized anxiety disorder) -     QUEtiapine (SEROQUEL) 50 MG tablet; TAKE 1-2 TABLETS BY MOUTH AT BEDTIME  Severe major depression without psychotic features (HCC) -     QUEtiapine (SEROQUEL) 50 MG tablet; TAKE 1-2 TABLETS BY MOUTH AT BEDTIME -     lithium carbonate 150 MG capsule; Take 2 capsules (300 mg total) by mouth at bedtime. -     ARIPiprazole (ABILIFY) 5 MG tablet; Take 1/2-1 tablet daily  Circadian rhythm sleep disorder, shift work type -     QUEtiapine (SEROQUEL) 50 MG tablet; TAKE 1-2 TABLETS BY  MOUTH AT BEDTIME  MDD (major depressive disorder), recurrent, in full remission (HCC)  Insomnia, unspecified type -     zolpidem (AMBIEN CR) 12.5 MG CR tablet; Take 1 tablet (12.5 mg total) by mouth at bedtime as needed for sleep.     Please see After Visit Summary for patient specific instructions.  Future Appointments  Date Time Provider Department Center  03/24/2021 11:30 AM Corie Chiquito, PMHNP [facility] None    No orders of the defined types were placed in this encounter.   -------------------------------

## 2021-02-07 NOTE — Telephone Encounter (Signed)
Disability provider statement was faxed

## 2021-02-14 ENCOUNTER — Ambulatory Visit: Payer: 59 | Admitting: Psychiatry

## 2021-02-14 NOTE — Telephone Encounter (Signed)
noted 

## 2021-02-15 ENCOUNTER — Telehealth: Payer: Self-pay | Admitting: Psychiatry

## 2021-02-15 NOTE — Telephone Encounter (Signed)
Received fax from [place] for completion of short term disability claim to be completed on [employer]. Placed on Traci's desk.

## 2021-02-16 NOTE — Telephone Encounter (Signed)
5 questions answered per request from nurse case manager. Form placed in Alejandro Fitzgerald's box to review and sign.  Records from December 2021, January 2022, and February 2022 need to also be faxed to [phone]

## 2021-02-16 NOTE — Telephone Encounter (Signed)
Noted thank you, will follow up with a call if he doesn't call back.

## 2021-02-20 NOTE — Telephone Encounter (Signed)
reviewed

## 2021-03-24 ENCOUNTER — Ambulatory Visit (INDEPENDENT_AMBULATORY_CARE_PROVIDER_SITE_OTHER): Payer: 59 | Admitting: Psychiatry

## 2021-03-24 ENCOUNTER — Encounter: Payer: Self-pay | Admitting: Psychiatry

## 2021-03-24 ENCOUNTER — Other Ambulatory Visit: Payer: Self-pay

## 2021-03-24 ENCOUNTER — Telehealth: Payer: Self-pay | Admitting: Psychiatry

## 2021-03-24 DIAGNOSIS — F331 Major depressive disorder, recurrent, moderate: Secondary | ICD-10-CM

## 2021-03-24 DIAGNOSIS — G4700 Insomnia, unspecified: Secondary | ICD-10-CM | POA: Diagnosis not present

## 2021-03-24 DIAGNOSIS — F411 Generalized anxiety disorder: Secondary | ICD-10-CM | POA: Diagnosis not present

## 2021-03-24 NOTE — Telephone Encounter (Signed)
Received Disability Provider Statement given to Traci. 3/25

## 2021-03-24 NOTE — Progress Notes (Signed)
Alejandro Fitzgerald [id number] 05/04/1966 55 y.o.  Subjective:   Patient ID:  Alejandro Fitzgerald is a 55 y.o. (DOB 25-Jun-1966) male.  Chief Complaint:  Chief Complaint  Patient presents with  . Follow-up    Anxiety, mood disturbance, and insomnia    HPI Alejandro Fitzgerald presents to the office today for follow-up of anxiety, mood disturbance, and insomnia. He reports that he had adverse effects with Abilify and seemed to worsen his mood. He reports that mood has improved somewhat and is starting to do more things. Still has not resumed many activities and spends most of his time at home, bowling, or spending time with family. He reports energy and motivation have been improving.   He reports that he has increased appetite at night from working evenings. He reports that he has times when he is unable to sleep and has taken an extra Ambien CR about 4 times in the last month. He reports that he is sleeping ok the other nights. He reports that lower dose of Seroquel has helped with nasal congestion. Appetite has been ok and reports that his weight has been fluctuating. Concentration is fair. He reports daily suicidal thoughts. He denies suicidal intent because of family. Contracts for safety.   Has been having PT for hamstring and knee.  Last saw therapist about a month ago.   He has his first court appearance Tuesday.   Has been bowling some and reports that his son is competing in a bowling championship.  He reports that he feels he is ready to return to work.   Past Psychiatric Medication Trials: Seroquel- Helpful for insomnia Wellbutrin XL  Viibryd  Ambien Ambien CR Lithium  AIMS   Flowsheet Row Office Visit from 11/10/2020 in [facility]  AIMS Total Score 0       Review of Systems:  Review of Systems  Gastrointestinal: Negative.   Musculoskeletal: Negative for gait problem.       Improved knee pain with PT  Neurological: Negative for tremors.   Psychiatric/Behavioral:       Please refer to HPI     Had acute GI s/s a few days ago.   Medications: I have reviewed the patient's current medications.  Current Outpatient Medications  Medication Sig Dispense Refill  . buPROPion (WELLBUTRIN XL) 150 MG 24 hr tablet TAKE 3 TABLETS(450 MG) BY MOUTH DAILY 270 tablet 1  . diphenhydrAMINE HCl, Sleep, 50 MG CAPS Take 50 mg by mouth at bedtime as needed.    Marland Kitchen esomeprazole (NEXIUM) 40 MG capsule Take by mouth.    . fluticasone (FLONASE) 50 MCG/ACT nasal spray Place into both nostrils as needed for allergies or rhinitis.    Marland Kitchen lithium carbonate 150 MG capsule Take 2 capsules (300 mg total) by mouth at bedtime. 180 capsule 0  . loratadine (CLARITIN) 10 MG tablet Take by mouth daily as needed.    . meloxicam (MOBIC) 15 MG tablet Take 15 mg by mouth daily.    . metoprolol succinate (TOPROL-XL) 50 MG 24 hr tablet Take 1 tablet by mouth daily.    . QUEtiapine (SEROQUEL) 50 MG tablet TAKE 1-2 TABLETS BY MOUTH AT BEDTIME 180 tablet 0  . simvastatin (ZOCOR) 20 MG tablet Take by mouth.    . Vilazodone HCl (VIIBRYD) 40 MG TABS Take 1 tablet (40 mg total) by mouth daily. 90 tablet 1  . zolpidem (AMBIEN CR) 12.5 MG CR tablet Take 1 tablet (12.5 mg total) by mouth at bedtime as  needed for sleep. 30 tablet 2   No current facility-administered medications for this visit.    Medication Side Effects: Other: Nasal congestion with Seroquel  Allergies:  Allergies  Allergen Reactions  . Penicillins Other (See Comments)    Unknown reaction Unknown as child     History reviewed. No pertinent past medical history.  History reviewed. No pertinent family history.  Social History   Socioeconomic History  . Marital status: Married    Spouse name: Not on file  . Number of children: Not on file  . Years of education: Not on file  . Highest education level: Not on file  Occupational History  . Not on file  Tobacco Use  . Smoking status: Former Games developer   . Smokeless tobacco: Never Used  Vaping Use  . Vaping Use: Never used  Substance and Sexual Activity  . Alcohol use: Not Currently  . Drug use: Never  . Sexual activity: Not on file  Other Topics Concern  . Not on file  Social History Narrative  . Not on file   Social Determinants of Health   Financial Resource Strain: Not on file  Food Insecurity: Not on file  Transportation Needs: Not on file  Physical Activity: Not on file  Stress: Not on file  Social Connections: Not on file  Intimate Partner Violence: Not on file    Past Medical History, Surgical history, Social history, and Family history were reviewed and updated as appropriate.   Please see review of systems for further details on the patient's review from today.   Objective:   Physical Exam:  There were no vitals taken for this visit.  Physical Exam Constitutional:      General: He is not in acute distress. Musculoskeletal:        General: No deformity.  Neurological:     Mental Status: He is alert and oriented to person, place, and time.     Coordination: Coordination normal.  Psychiatric:        Attention and Perception: Attention and perception normal. He does not perceive auditory or visual hallucinations.        Mood and Affect: Affect is not labile, blunt, angry or inappropriate.        Speech: Speech normal.        Behavior: Behavior normal.        Thought Content: Thought content normal. Thought content is not paranoid or delusional. Thought content does not include homicidal or suicidal ideation. Thought content does not include homicidal or suicidal plan.        Cognition and Memory: Cognition and memory normal.        Judgment: Judgment normal.     Comments: Insight intact Mood presents as less anxious and less depressed compared to last visit     Lab Review:  No results found for: NA, K, CL, CO2, GLUCOSE, BUN, CREATININE, CALCIUM, PROT, ALBUMIN, AST, ALT, ALKPHOS, BILITOT, GFRNONAA,  GFRAA  No results found for: WBC, RBC, HGB, HCT, PLT, MCV, MCH, MCHC, RDW, LYMPHSABS, MONOABS, EOSABS, BASOSABS  No results found for: POCLITH, LITHIUM   No results found for: PHENYTOIN, PHENOBARB, VALPROATE, CBMZ   .res Assessment: Plan:   Patient seen for 30 minutes.  Patient reports some recent improvement in mood and anxiety signs and symptoms and prefers to continue current medications without changes. Continue Wellbutrin XL 450 mg daily for depression. Continue lithium 300 mg at bedtime for mood signs and symptoms. Continue Seroquel 50 mg 1-2 tabs at  bedtime for insomnia Continue Ambien CR 12.5 mg at bedtime for insomnia.  Advised patient not to take more than 1 Ambien at night. Continue Viibryd 40 mg daily for anxiety and depression. Patient to follow-up in 6 weeks or sooner if clinically indicated. Able to return to work on 04/07/21 without restrictions. Patient advised to contact office with any questions, adverse effects, or acute worsening in signs and symptoms.    There are no diagnoses linked to this encounter.   Please see After Visit Summary for patient specific instructions.  Future Appointments  Date Time Provider Department Center  05/08/2021  8:30 AM Corie Chiquito, PMHNP [facility] None    No orders of the defined types were placed in this encounter.   -------------------------------

## 2021-03-30 DIAGNOSIS — Z0289 Encounter for other administrative examinations: Secondary | ICD-10-CM

## 2021-03-30 NOTE — Telephone Encounter (Signed)
Paper work completed and given to [name] to review and sign 

## 2021-04-21 ENCOUNTER — Other Ambulatory Visit: Payer: Self-pay

## 2021-04-21 DIAGNOSIS — F322 Major depressive disorder, single episode, severe without psychotic features: Secondary | ICD-10-CM

## 2021-04-21 DIAGNOSIS — G4726 Circadian rhythm sleep disorder, shift work type: Secondary | ICD-10-CM

## 2021-04-21 DIAGNOSIS — F411 Generalized anxiety disorder: Secondary | ICD-10-CM

## 2021-04-21 MED ORDER — QUETIAPINE FUMARATE 50 MG PO TABS: ORAL_TABLET | ORAL | 0 refills | Status: DC

## 2021-05-08 ENCOUNTER — Ambulatory Visit: Payer: 59 | Admitting: Psychiatry

## 2021-05-31 ENCOUNTER — Telehealth: Payer: Self-pay | Admitting: Psychiatry

## 2021-05-31 NOTE — Telephone Encounter (Signed)
Received fax from [facility] regarding [employer]. Completion needed for RTW form. Placed on Traci's desk.

## 2021-06-01 ENCOUNTER — Telehealth: Payer: Self-pay | Admitting: Psychiatry

## 2021-06-01 NOTE — Telephone Encounter (Signed)
Pt requesting Rx for Zolpidem 12.5 mg CR tab @ [facility] [address]. [address]

## 2021-06-02 ENCOUNTER — Other Ambulatory Visit: Payer: Self-pay

## 2021-06-02 DIAGNOSIS — G4700 Insomnia, unspecified: Secondary | ICD-10-CM

## 2021-06-02 MED ORDER — ZOLPIDEM TARTRATE ER 12.5 MG PO TBCR
12.5000 mg | EXTENDED_RELEASE_TABLET | Freq: Every evening | ORAL | 2 refills | Status: DC | PRN
Start: 2021-06-02 — End: 2021-07-21

## 2021-06-02 MED ORDER — ZOLPIDEM TARTRATE ER 12.5 MG PO TBCR
12.5000 mg | EXTENDED_RELEASE_TABLET | Freq: Every evening | ORAL | 2 refills | Status: DC | PRN
Start: 2021-06-02 — End: 2021-06-02

## 2021-06-02 NOTE — Telephone Encounter (Signed)
Last refill 05/03/21 Pended for Shanda Bumps to review and send

## 2021-06-02 NOTE — Addendum Note (Signed)
Addended by: Derenda Mis on: 06/02/2021 01:54 PM   Modules accepted: Orders

## 2021-06-09 ENCOUNTER — Telehealth: Payer: Self-pay

## 2021-06-09 NOTE — Telephone Encounter (Signed)
Received disability paperwork on pt, his last apt was 03/24/21 and his forms were updated then. His apt in May was canceled, he is now rescheduled for 06/23/21. After apt will update his forms.

## 2021-06-09 NOTE — Telephone Encounter (Signed)
Noted  

## 2021-06-16 ENCOUNTER — Other Ambulatory Visit: Payer: Self-pay | Admitting: Psychiatry

## 2021-06-16 DIAGNOSIS — F3342 Major depressive disorder, recurrent, in full remission: Secondary | ICD-10-CM

## 2021-06-16 DIAGNOSIS — F411 Generalized anxiety disorder: Secondary | ICD-10-CM

## 2021-06-23 ENCOUNTER — Ambulatory Visit: Payer: 59 | Admitting: Psychiatry

## 2021-06-26 ENCOUNTER — Ambulatory Visit: Payer: 59 | Admitting: Psychiatry

## 2021-07-18 ENCOUNTER — Telehealth: Payer: Self-pay | Admitting: Psychiatry

## 2021-07-18 NOTE — Telephone Encounter (Signed)
Patient called in for refill on Seroquel 50mg . Ph: [phone]. Pharmacy [facility] [address] [place], [place]

## 2021-07-18 NOTE — Telephone Encounter (Signed)
He needs to make an appt he has no showed twice

## 2021-07-19 NOTE — Telephone Encounter (Signed)
Called back made appt 7/22

## 2021-07-19 NOTE — Telephone Encounter (Signed)
Called pt and LVM to schedule appt

## 2021-07-21 ENCOUNTER — Encounter: Payer: Self-pay | Admitting: Psychiatry

## 2021-07-21 ENCOUNTER — Other Ambulatory Visit: Payer: Self-pay

## 2021-07-21 ENCOUNTER — Ambulatory Visit: Payer: 59 | Admitting: Psychiatry

## 2021-07-21 DIAGNOSIS — F411 Generalized anxiety disorder: Secondary | ICD-10-CM | POA: Diagnosis not present

## 2021-07-21 DIAGNOSIS — G4700 Insomnia, unspecified: Secondary | ICD-10-CM | POA: Diagnosis not present

## 2021-07-21 DIAGNOSIS — G4726 Circadian rhythm sleep disorder, shift work type: Secondary | ICD-10-CM | POA: Diagnosis not present

## 2021-07-21 DIAGNOSIS — F331 Major depressive disorder, recurrent, moderate: Secondary | ICD-10-CM | POA: Diagnosis not present

## 2021-07-21 MED ORDER — BUPROPION HCL ER (XL) 150 MG PO TB24: ORAL_TABLET | ORAL | 1 refills | Status: DC

## 2021-07-21 MED ORDER — VILAZODONE HCL 40 MG PO TABS: 40.0000 mg | ORAL_TABLET | Freq: Every day | ORAL | 1 refills | Status: DC

## 2021-07-21 MED ORDER — QUETIAPINE FUMARATE 50 MG PO TABS
ORAL_TABLET | ORAL | 0 refills | Status: DC
Start: 2021-07-21 — End: 2021-10-20

## 2021-07-21 MED ORDER — ZOLPIDEM TARTRATE ER 12.5 MG PO TBCR
12.5000 mg | EXTENDED_RELEASE_TABLET | Freq: Every evening | ORAL | 2 refills | Status: DC | PRN
Start: 2021-08-26 — End: 2021-10-20

## 2021-07-21 NOTE — Progress Notes (Signed)
   07/21/21 1121  Facial and Oral Movements  Muscles of Facial Expression 0  Lips and Perioral Area 0  Jaw 0  Tongue 0  Extremity Movements  Upper (arms, wrists, hands, fingers) 0  Lower (legs, knees, ankles, toes) 0  Trunk Movements  Neck, shoulders, hips 0  Overall Severity  Severity of abnormal movements (highest score from questions above) 0  Patient's awareness of abnormal movements (rate only patient's report) 0

## 2021-07-21 NOTE — Progress Notes (Signed)
Alejandro Fitzgerald [id number] 04/18/1966 55 y.o.  Subjective:   Patient ID:  Alejandro Fitzgerald is a 55 y.o. (DOB 06-15-1966) male.  Chief Complaint:  Chief Complaint  Patient presents with   Follow-up    Depression, Anxiety, insomnia     HPI Alejandro Fitzgerald presents to the office today for follow-up of depression, anxiety, and insomnia. He describes his mood as "steady." He reports that Seroquel seems to help him "wind down and sleep better." He reports sleep can vary with working nights. He reports that he has frequent awakenings. Anxiety has been "calm." Denies any panic attacks. Energy and motivation have been "steady." Concentration has been ok. He reports chronic suicidal thoughts. Denies access to guns. Denies suicidal intent. Denies any worsening in suicidal thoughts. Contracts for safety.   He has been working again and works rotating 12-hour nights. 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 Returned to work in April. He reports that return to work has been going ok. He reports, "being back at work helps." Has visits with son and was unable to see son while he was away.   Reports that he has not taken Lithium in "months." Denies any change in suicidal thoughts.   Has not seen therapist recently.   Goes to court 08/15/21 regarding accusations.   Past Psychiatric Medication Trials: Seroquel- Helpful for insomnia Wellbutrin XL Viibryd Ambien Ambien CR Lithium  AIMS    Flowsheet Row Office Visit from 11/10/2020 in [facility]  AIMS Total Score 0        Review of Systems:  Review of Systems  Gastrointestinal: Negative.   Musculoskeletal:  Negative for gait problem.  Neurological:  Negative for tremors.  Psychiatric/Behavioral:         Please refer to HPI   Medications: I have reviewed the patient's current medications.  Current Outpatient Medications  Medication Sig Dispense Refill   esomeprazole (NEXIUM) 40 MG  capsule Take by mouth.     fluticasone (FLONASE) 50 MCG/ACT nasal spray Place into both nostrils as needed for allergies or rhinitis.     loratadine (CLARITIN) 10 MG tablet Take by mouth daily as needed.     meloxicam (MOBIC) 15 MG tablet Take 15 mg by mouth daily.     metoprolol succinate (TOPROL-XL) 50 MG 24 hr tablet Take 1 tablet by mouth daily.     simvastatin (ZOCOR) 20 MG tablet Take by mouth.     buPROPion (WELLBUTRIN XL) 150 MG 24 hr tablet TAKE 3 TABLETS(450 MG) BY MOUTH DAILY 270 tablet 1   QUEtiapine (SEROQUEL) 50 MG tablet TAKE 1-3 TABLETS BY MOUTH AT BEDTIME 270 tablet 0   Vilazodone HCl (VIIBRYD) 40 MG TABS Take 1 tablet (40 mg total) by mouth daily. 90 tablet 1   [START ON 08/26/2021] zolpidem (AMBIEN CR) 12.5 MG CR tablet Take 1 tablet (12.5 mg total) by mouth at bedtime as needed for sleep. 30 tablet 2   No current facility-administered medications for this visit.    Medication Side Effects: None  Allergies:  Allergies  Allergen Reactions   Penicillins Other (See Comments)    Unknown reaction Unknown as child     History reviewed. No pertinent past medical history.  Past Medical History, Surgical history, Social history, and Family history were reviewed and updated as appropriate.   Please see review of systems for further details on the patient's review from today.   Objective:   Physical Exam:  There were no vitals taken for this visit.  Physical Exam Constitutional:      General: He is not in acute distress. Musculoskeletal:        General: No deformity.  Neurological:     Mental Status: He is alert and oriented to person, place, and time.     Coordination: Coordination normal.  Psychiatric:        Attention and Perception: Attention and perception normal. He does not perceive auditory or visual hallucinations.        Mood and Affect: Mood is anxious and depressed. Affect is not labile, blunt, angry or inappropriate.        Speech: Speech normal.         Behavior: Behavior normal.        Thought Content: Thought content normal. Thought content is not paranoid or delusional. Thought content does not include homicidal or suicidal ideation. Thought content does not include homicidal or suicidal plan.        Cognition and Memory: Cognition and memory normal.        Judgment: Judgment normal.     Comments: Insight intact    Lab Review:  No results found for: NA, K, CL, CO2, GLUCOSE, BUN, CREATININE, CALCIUM, PROT, ALBUMIN, AST, ALT, ALKPHOS, BILITOT, GFRNONAA, GFRAA  No results found for: WBC, RBC, HGB, HCT, PLT, MCV, MCH, MCHC, RDW, LYMPHSABS, MONOABS, EOSABS, BASOSABS  No results found for: POCLITH, LITHIUM   No results found for: PHENYTOIN, PHENOBARB, VALPROATE, CBMZ   .res Assessment: Plan:    Pt seen for 30 minutes and time spent discussing treatment plan. He requests to increase Seroquel to 150 mg at bedtime to improve insomnia and anxiety since he reports that Seroquel has been helpful for anxious thoughts at night. Will increase Seroquel to 150 mg po QHS for insomnia and mood.  Continue Wellbutrin XL 450 mg po qd for depression.  Continue Viibryd 40 mg daily for anxiety and depression.  Continue Ambien 12.5 mg po QHS prn insomnia.  Will not re-start Lithium since he reports that he has not had any worsening depression or suicidal thoughts since stopping Lithium several months ago.  Pt to follow-up in 3 months or sooner if clinically indicated.  Patient advised to contact office with any questions, adverse effects, or acute worsening in signs and symptoms.   Alejandro Fitzgerald was seen today for follow-up.  Diagnoses and all orders for this visit:  GAD (generalized anxiety disorder) -     QUEtiapine (SEROQUEL) 50 MG tablet; TAKE 1-3 TABLETS BY MOUTH AT BEDTIME -     buPROPion (WELLBUTRIN XL) 150 MG 24 hr tablet; TAKE 3 TABLETS(450 MG) BY MOUTH DAILY -     Vilazodone HCl (VIIBRYD) 40 MG TABS; Take 1 tablet (40 mg total) by mouth  daily.  Severe major depression without psychotic features (HCC) -     QUEtiapine (SEROQUEL) 50 MG tablet; TAKE 1-3 TABLETS BY MOUTH AT BEDTIME  Circadian rhythm sleep disorder, shift work type -     QUEtiapine (SEROQUEL) 50 MG tablet; TAKE 1-3 TABLETS BY MOUTH AT BEDTIME  MDD (major depressive disorder), recurrent, in full remission (HCC) -     buPROPion (WELLBUTRIN XL) 150 MG 24 hr tablet; TAKE 3 TABLETS(450 MG) BY MOUTH DAILY -     Vilazodone HCl (VIIBRYD) 40 MG TABS; Take 1 tablet (40 mg total) by mouth daily.  Insomnia, unspecified type -     zolpidem (AMBIEN CR) 12.5 MG CR tablet; Take 1 tablet (12.5 mg  total) by mouth at bedtime as needed for sleep.    Please see After Visit Summary for patient specific instructions.  Future Appointments  Date Time Provider Department Center  10/20/2021  1:45 PM Corie Chiquito, PMHNP [facility] None    No orders of the defined types were placed in this encounter.   -------------------------------

## 2021-08-09 ENCOUNTER — Other Ambulatory Visit: Payer: Self-pay | Admitting: Psychiatry

## 2021-08-09 DIAGNOSIS — F411 Generalized anxiety disorder: Secondary | ICD-10-CM

## 2021-09-14 ENCOUNTER — Telehealth: Payer: Self-pay | Admitting: Psychiatry

## 2021-09-14 NOTE — Telephone Encounter (Signed)
Asking for Viibryd brand instead of generic

## 2021-09-14 NOTE — Telephone Encounter (Signed)
Patient lm requesting a refill on the Viibryd not the generic. He stated the original is cheaper. Fill at the [place] on [address]. A follow up appointment is scheduled for 10/21.

## 2021-09-17 ENCOUNTER — Other Ambulatory Visit: Payer: Self-pay

## 2021-09-17 DIAGNOSIS — F411 Generalized anxiety disorder: Secondary | ICD-10-CM

## 2021-09-17 MED ORDER — VIIBRYD 40 MG PO TABS: 40.0000 mg | ORAL_TABLET | Freq: Every day | ORAL | 1 refills | Status: DC

## 2021-09-17 NOTE — Telephone Encounter (Signed)
Rx sent to [facility] (pharmacy on file) note [address]

## 2021-10-20 ENCOUNTER — Encounter: Payer: Self-pay | Admitting: Psychiatry

## 2021-10-20 ENCOUNTER — Other Ambulatory Visit: Payer: Self-pay

## 2021-10-20 ENCOUNTER — Ambulatory Visit: Payer: 59 | Admitting: Psychiatry

## 2021-10-20 DIAGNOSIS — Z79899 Other long term (current) drug therapy: Secondary | ICD-10-CM

## 2021-10-20 DIAGNOSIS — F411 Generalized anxiety disorder: Secondary | ICD-10-CM | POA: Diagnosis not present

## 2021-10-20 DIAGNOSIS — G4700 Insomnia, unspecified: Secondary | ICD-10-CM

## 2021-10-20 DIAGNOSIS — F331 Major depressive disorder, recurrent, moderate: Secondary | ICD-10-CM | POA: Diagnosis not present

## 2021-10-20 DIAGNOSIS — G4726 Circadian rhythm sleep disorder, shift work type: Secondary | ICD-10-CM

## 2021-10-20 MED ORDER — ZOLPIDEM TARTRATE ER 12.5 MG PO TBCR: 12.5000 mg | EXTENDED_RELEASE_TABLET | Freq: Every evening | ORAL | 2 refills | Status: DC | PRN

## 2021-10-20 MED ORDER — BUPROPION HCL ER (XL) 150 MG PO TB24: ORAL_TABLET | ORAL | 0 refills | Status: DC

## 2021-10-20 MED ORDER — QUETIAPINE FUMARATE 50 MG PO TABS: ORAL_TABLET | ORAL | 0 refills | Status: DC

## 2021-10-20 NOTE — Progress Notes (Signed)
Alejandro Fitzgerald [id number] 05-Apr-1966 55 y.o.  Subjective:   Patient ID:  Alejandro Fitzgerald is a 55 y.o. (DOB [birth year]) male.  Chief Complaint:  Chief Complaint  Patient presents with   Follow-up    Anxiety, depression, and insomnia     HPI Alejandro Fitzgerald presents to the office today for follow-up of depression, anxiety, and insomnia. He reports that anxiety has been ok. He reports that Seroquel may be "sort of a downer and slow me down." He reports that he adjusts dose depending on his work schedule, ie. Taking only 1 tab when he works days and taking 2-3 when he does not work the following day. He reports an adequate amount of sleep on days off. He reports less sleep when he works and gets up at 5:15 pm.  Mood has been "pretty good." He reports that he has some sad mood and chronic suicidal thoughts. Has been doing projects at home this week during time off. He reports lower energy and motivation and thinks it could be due to Seroquel. Concentration is adequate. He reports that he has had some weight gain and attributes this to Seroquel. He reports that he gets hungry every night.   He reports that he has chronic suicidal thoughts. Denies any worsening suicidal thoughts. Denies suicidal intent. He reports that talking with his sister is helpful for dealing with thoughts. Denies SI at time of exam.   Has not seen daughter in 2 years. Sees son a couple of times a week.  He reports that work has been going ok.   He reports that he will go to court in January.   Past Psychiatric Medication Trials: Seroquel- Helpful for insomnia Wellbutrin XL Viibryd Ambien Ambien CR Lithium  AIMS    Flowsheet Row Office Visit from 10/20/2021 in [facility] Office Visit from 11/10/2020 in [facility]  AIMS Total Score 0 0        Review of Systems:  Review of Systems  Musculoskeletal:  Negative for gait problem.  Neurological:  Negative for tremors.   Psychiatric/Behavioral:         Please refer to HPI   Medications: I have reviewed the patient's current medications.  Current Outpatient Medications  Medication Sig Dispense Refill   esomeprazole (NEXIUM) 40 MG capsule Take by mouth.     fluticasone (FLONASE) 50 MCG/ACT nasal spray Place into both nostrils as needed for allergies or rhinitis.     meloxicam (MOBIC) 15 MG tablet Take 15 mg by mouth daily.     metoprolol succinate (TOPROL-XL) 50 MG 24 hr tablet Take 1 tablet by mouth daily.     simvastatin (ZOCOR) 20 MG tablet Take by mouth.     VIIBRYD 40 MG TABS Take 1 tablet (40 mg total) by mouth daily. 90 tablet 1   buPROPion (WELLBUTRIN XL) 150 MG 24 hr tablet TAKE 3 TABLETS(450 MG) BY MOUTH DAILY 270 tablet 0   QUEtiapine (SEROQUEL) 50 MG tablet TAKE 1-3 TABLETS BY MOUTH AT BEDTIME 270 tablet 0   [START ON 11/15/2021] zolpidem (AMBIEN CR) 12.5 MG CR tablet Take 1 tablet (12.5 mg total) by mouth at bedtime as needed for sleep. 30 tablet 2   No current facility-administered medications for this visit.    Medication Side Effects: Other: increased appetite/wt gain, fatigue  Allergies:  Allergies  Allergen Reactions   Penicillins Other (See Comments)    Unknown reaction Unknown as child     History reviewed. No pertinent past  medical history.  Past Medical History, Surgical history, Social history, and Family history were reviewed and updated as appropriate.   Please see review of systems for further details on the patient's review from today.   Objective:   Physical Exam:  There were no vitals taken for this visit.  Physical Exam Constitutional:      General: He is not in acute distress. Musculoskeletal:        General: No deformity.  Neurological:     Mental Status: He is alert and oriented to person, place, and time.     Coordination: Coordination normal.  Psychiatric:        Attention and Perception: Attention and perception normal. He does not perceive  auditory or visual hallucinations.        Mood and Affect: Mood is depressed. Mood is not anxious. Affect is not labile, blunt, angry or inappropriate.        Speech: Speech normal.        Behavior: Behavior normal.        Thought Content: Thought content normal. Thought content is not paranoid or delusional. Thought content does not include homicidal or suicidal ideation. Thought content does not include homicidal or suicidal plan.        Cognition and Memory: Cognition and memory normal.        Judgment: Judgment normal.     Comments: Insight intact    Lab Review:  No results found for: NA, K, CL, CO2, GLUCOSE, BUN, CREATININE, CALCIUM, PROT, ALBUMIN, AST, ALT, ALKPHOS, BILITOT, GFRNONAA, GFRAA  No results found for: WBC, RBC, HGB, HCT, PLT, MCV, MCH, MCHC, RDW, LYMPHSABS, MONOABS, EOSABS, BASOSABS  No results found for: POCLITH, LITHIUM   No results found for: PHENYTOIN, PHENOBARB, VALPROATE, CBMZ   .res Assessment: Plan:    Pt seen for 30 minutes and time spent discussing long-term use of Seroquel and plan to possibly reduce Seroquel in the future. Discussed obtaining labs to monitor for adverse effects with Seroquel. Will order Lipid Panel, Hgb A1C, and CMP. Discussed diagnosis and discussed that this provider and previous provider, Alejandro Fitzgerald, have diagnosed him with Major Depressive Disorder and not Bipolar D/O.  Continue Seroquel 50 mg 1-3 tablets at bedtime for GAD and insomnia.  Continue Ambien CR 12.5 mg po QHS prn insomnia. Continue Wellbutrin XL 150 mg po q am for depression.  Continue Viibryd 40 mg po qd for mood and anxiety.  Pt to follow-up in 3 months or sooner if clinically indicated.  Patient advised to contact office with any questions, adverse effects, or acute worsening in signs and symptoms.   Alejandro Fitzgerald was seen today for follow-up.  Diagnoses and all orders for this visit:  Moderate episode of recurrent major depressive disorder (HCC)  High risk medication  use -     Hemoglobin A1c -     Comprehensive metabolic panel -     Lipid panel  GAD (generalized anxiety disorder) -     buPROPion (WELLBUTRIN XL) 150 MG 24 hr tablet; TAKE 3 TABLETS(450 MG) BY MOUTH DAILY -     QUEtiapine (SEROQUEL) 50 MG tablet; TAKE 1-3 TABLETS BY MOUTH AT BEDTIME  Circadian rhythm sleep disorder, shift work type -     QUEtiapine (SEROQUEL) 50 MG tablet; TAKE 1-3 TABLETS BY MOUTH AT BEDTIME  Insomnia, unspecified type -     zolpidem (AMBIEN CR) 12.5 MG CR tablet; Take 1 tablet (12.5 mg total) by mouth at bedtime as needed for sleep.  Please see After Visit Summary for patient specific instructions.  Future Appointments  Date Time Provider Department Center  01/18/2022  2:30 PM Corie Chiquito, PMHNP [facility] None     Orders Placed This Encounter  Procedures   Hemoglobin A1c   Comprehensive metabolic panel   Lipid panel     -------------------------------

## 2021-10-27 LAB — COMPREHENSIVE METABOLIC PANEL
AG Ratio: 1.9 (calc) (ref 1.0–2.5)
ALT: 31 U/L (ref 9–46)
AST: 21 U/L (ref 10–35)
Albumin: 4.6 g/dL (ref 3.6–5.1)
Alkaline phosphatase (APISO): 91 U/L (ref 35–144)
BUN: 14 mg/dL (ref 7–25)
CO2: 23 mmol/L (ref 20–32)
Calcium: 9.2 mg/dL (ref 8.6–10.3)
Chloride: 107 mmol/L (ref 98–110)
Creat: 0.91 mg/dL (ref 0.70–1.30)
Globulin: 2.4 g/dL (calc) (ref 1.9–3.7)
Glucose, Bld: 119 mg/dL — ABNORMAL HIGH (ref 65–99)
Potassium: 4.3 mmol/L (ref 3.5–5.3)
Sodium: 140 mmol/L (ref 135–146)
Total Bilirubin: 0.3 mg/dL (ref 0.2–1.2)
Total Protein: 7 g/dL (ref 6.1–8.1)

## 2021-10-27 LAB — HEMOGLOBIN A1C
Hgb A1c MFr Bld: 6.3 % of total Hgb — ABNORMAL HIGH (ref <5.7)
Mean Plasma Glucose: 134 mg/dL
eAG (mmol/L): 7.4 mmol/L

## 2021-10-27 LAB — LIPID PANEL
Cholesterol: 175 mg/dL (ref <200)
HDL: 29 mg/dL — ABNORMAL LOW (ref >=40)
LDL Cholesterol (Calc): 110 mg/dL (calc) — ABNORMAL HIGH
Non-HDL Cholesterol (Calc): 146 mg/dL (calc) — ABNORMAL HIGH (ref <130)
Total CHOL/HDL Ratio: 6 (calc) — ABNORMAL HIGH (ref <5.0)
Triglycerides: 240 mg/dL — ABNORMAL HIGH (ref <150)

## 2021-10-30 NOTE — Progress Notes (Signed)
Pt has been informed.

## 2021-11-06 ENCOUNTER — Telehealth: Payer: Self-pay | Admitting: Psychiatry

## 2021-11-06 NOTE — Telephone Encounter (Signed)
Mailed letter to Pt's home address per Pt.

## 2022-01-18 ENCOUNTER — Other Ambulatory Visit: Payer: Self-pay

## 2022-01-18 ENCOUNTER — Ambulatory Visit: Payer: 59 | Admitting: Psychiatry

## 2022-01-18 ENCOUNTER — Encounter: Payer: Self-pay | Admitting: Psychiatry

## 2022-01-18 DIAGNOSIS — G4700 Insomnia, unspecified: Secondary | ICD-10-CM | POA: Diagnosis not present

## 2022-01-18 DIAGNOSIS — F411 Generalized anxiety disorder: Secondary | ICD-10-CM | POA: Diagnosis not present

## 2022-01-18 DIAGNOSIS — G4726 Circadian rhythm sleep disorder, shift work type: Secondary | ICD-10-CM

## 2022-01-18 MED ORDER — ZOLPIDEM TARTRATE ER 12.5 MG PO TBCR: 12.5000 mg | EXTENDED_RELEASE_TABLET | Freq: Every evening | ORAL | 5 refills | Status: DC | PRN

## 2022-01-18 MED ORDER — QUETIAPINE FUMARATE 50 MG PO TABS: ORAL_TABLET | ORAL | 1 refills | Status: DC

## 2022-01-18 MED ORDER — VIIBRYD 40 MG PO TABS: 40.0000 mg | ORAL_TABLET | Freq: Every day | ORAL | 1 refills | Status: DC

## 2022-01-18 MED ORDER — BUPROPION HCL ER (XL) 150 MG PO TB24: ORAL_TABLET | ORAL | 1 refills | Status: DC

## 2022-01-18 NOTE — Progress Notes (Signed)
Alejandro Fitzgerald [id number] [birth year] 56 y.o.  Subjective:   Patient ID:  Alejandro Fitzgerald is a 56 y.o. (DOB July 09, 1966) male.  Chief Complaint:  Chief Complaint  Patient presents with   Follow-up    Depression, anxiety, and insomnia    HPI TAIT BALISTRERI presents to the office today for follow-up of depression, anxiety, and insomnia. "Everything's been ok." Mood has been "pretty good." He reports that he is not depressed. Anxiety has been ok. He reports that he sleeps better when he works nights and can sleep better in the day. He reports that he has to take medication to sleep at night when he is working days. Energy and motivation have been good. He reports that he has changed his diet to a Mediterranean died and notices energy is better. Has decreased soda intake. Concentration has been good and denies any mistakes at work. He reports that he continues to have chronic fleeting vague suicidal thoughts. Denies suicidal intent. Contracts for safety.   Talks with sister 1-2 times a day. Reports that she and their father are supportive.   Sees son periodically. Anticipates court date will be continued.   Past Psychiatric Medication Trials: Seroquel- Helpful for insomnia Wellbutrin XL Viibryd Ambien Ambien CR Lithium  AIMS    Flowsheet Row Office Visit from 01/18/2022 in [facility] Office Visit from 10/20/2021 in [facility] Office Visit from 11/10/2020 in [facility]  AIMS Total Score 0 0 0        Review of Systems:  Review of Systems  Musculoskeletal:  Negative for gait problem.  Neurological:  Negative for tremors.  Psychiatric/Behavioral:         Please refer to HPI   Medications: I have reviewed the patient's current medications.  Current Outpatient Medications  Medication Sig Dispense Refill   esomeprazole (NEXIUM) 40 MG capsule Take by mouth.     fluticasone (FLONASE) 50 MCG/ACT nasal spray Place into both nostrils  as needed for allergies or rhinitis.     meloxicam (MOBIC) 15 MG tablet Take 15 mg by mouth daily.     metoprolol succinate (TOPROL-XL) 50 MG 24 hr tablet Take 1 tablet by mouth daily.     simvastatin (ZOCOR) 40 MG tablet Take 40 mg by mouth.     buPROPion (WELLBUTRIN XL) 150 MG 24 hr tablet TAKE 3 TABLETS(450 MG) BY MOUTH DAILY 270 tablet 1   QUEtiapine (SEROQUEL) 50 MG tablet TAKE 1-3 TABLETS BY MOUTH AT BEDTIME 270 tablet 1   VIIBRYD 40 MG TABS Take 1 tablet (40 mg total) by mouth daily. 90 tablet 1   [START ON 02/08/2022] zolpidem (AMBIEN CR) 12.5 MG CR tablet Take 1 tablet (12.5 mg total) by mouth at bedtime as needed for sleep. 30 tablet 5   No current facility-administered medications for this visit.    Medication Side Effects: Other: Weight gain with higher doses of Seroquel  Allergies:  Allergies  Allergen Reactions   Penicillins Other (See Comments)    Unknown reaction Unknown as child     History reviewed. No pertinent past medical history.  Past Medical History, Surgical history, Social history, and Family history were reviewed and updated as appropriate.   Please see review of systems for further details on the patient's review from today.   Objective:   Physical Exam:  Wt 206 lb (93.4 kg)    BMI 28.73 kg/m   Physical Exam Constitutional:      General: He is not in  acute distress. Musculoskeletal:        General: No deformity.  Neurological:     Mental Status: He is alert and oriented to person, place, and time.     Coordination: Coordination normal.  Psychiatric:        Attention and Perception: Attention and perception normal. He does not perceive auditory or visual hallucinations.        Mood and Affect: Mood normal. Mood is not anxious or depressed. Affect is not labile, blunt, angry or inappropriate.        Speech: Speech normal.        Behavior: Behavior normal.        Thought Content: Thought content normal. Thought content is not paranoid or  delusional. Thought content does not include homicidal or suicidal ideation. Thought content does not include homicidal or suicidal plan.        Cognition and Memory: Cognition and memory normal.        Judgment: Judgment normal.     Comments: Insight intact    Lab Review:     Component Value Date/Time   NA 140 10/26/2021 1449   K 4.3 10/26/2021 1449   CL 107 10/26/2021 1449   CO2 23 10/26/2021 1449   GLUCOSE 119 (H) 10/26/2021 1449   BUN 14 10/26/2021 1449   CREATININE 0.91 10/26/2021 1449   CALCIUM 9.2 10/26/2021 1449   PROT 7.0 10/26/2021 1449   AST 21 10/26/2021 1449   ALT 31 10/26/2021 1449   BILITOT 0.3 10/26/2021 1449    No results found for: WBC, RBC, HGB, HCT, PLT, MCV, MCH, MCHC, RDW, LYMPHSABS, MONOABS, EOSABS, BASOSABS  No results found for: POCLITH, LITHIUM   No results found for: PHENYTOIN, PHENOBARB, VALPROATE, CBMZ   .res Assessment: Plan:   Pt seen for 25 minutes and time spent discussing potential metabolic side effects of Seroquel since labs indicated increased cholesterol and pre-diabetes. He reports that he has been attempting to use lowest possible effective dose of Seroquel to minimize weight and discussed that this would also be helpful with minimizing metabolic side effects as well.  He reports that he would like to continue current medications without changes.  Continue Wellbutrin XL 450 mg po q am for depression.  Continue Viibryd 40 mg po qd for anxiety and depression.  Continue Seroquel 50 mg 1-3 tabs po QHS for insomnia  Continue Ambien CR 12.5 mg po QHS prn insomnia.  Pt to follow-up in 6 months or sooner if clinically indicated.  Patient advised to contact office with any questions, adverse effects, or acute worsening in signs and symptoms.    Karell was seen today for follow-up.  Diagnoses and all orders for this visit:  GAD (generalized anxiety disorder) -     buPROPion (WELLBUTRIN XL) 150 MG 24 hr tablet; TAKE 3 TABLETS(450 MG) BY MOUTH  DAILY -     QUEtiapine (SEROQUEL) 50 MG tablet; TAKE 1-3 TABLETS BY MOUTH AT BEDTIME -     VIIBRYD 40 MG TABS; Take 1 tablet (40 mg total) by mouth daily.  Circadian rhythm sleep disorder, shift work type -     QUEtiapine (SEROQUEL) 50 MG tablet; TAKE 1-3 TABLETS BY MOUTH AT BEDTIME  Insomnia, unspecified type -     zolpidem (AMBIEN CR) 12.5 MG CR tablet; Take 1 tablet (12.5 mg total) by mouth at bedtime as needed for sleep.     Please see After Visit Summary for patient specific instructions.  Future Appointments  Date Time Provider  Department Center  07/19/2022  4:00 PM Corie Chiquitoarter, Elber Galyean, PMHNP [facility] None    No orders of the defined types were placed in this encounter.   -------------------------------

## 2022-02-09 ENCOUNTER — Other Ambulatory Visit: Payer: Self-pay

## 2022-02-09 DIAGNOSIS — F411 Generalized anxiety disorder: Secondary | ICD-10-CM

## 2022-02-09 MED ORDER — VILAZODONE HCL 40 MG PO TABS: 40.0000 mg | ORAL_TABLET | Freq: Every day | ORAL | 1 refills | Status: DC

## 2022-07-19 ENCOUNTER — Telehealth (INDEPENDENT_AMBULATORY_CARE_PROVIDER_SITE_OTHER): Payer: 59 | Admitting: Psychiatry

## 2022-07-19 ENCOUNTER — Encounter: Payer: Self-pay | Admitting: Psychiatry

## 2022-07-19 DIAGNOSIS — F411 Generalized anxiety disorder: Secondary | ICD-10-CM

## 2022-07-19 DIAGNOSIS — G4700 Insomnia, unspecified: Secondary | ICD-10-CM

## 2022-07-19 DIAGNOSIS — G4726 Circadian rhythm sleep disorder, shift work type: Secondary | ICD-10-CM | POA: Diagnosis not present

## 2022-07-19 MED ORDER — QUETIAPINE FUMARATE 50 MG PO TABS: ORAL_TABLET | ORAL | 1 refills | Status: DC

## 2022-07-19 MED ORDER — BUPROPION HCL ER (XL) 150 MG PO TB24: ORAL_TABLET | ORAL | 1 refills | Status: DC

## 2022-07-19 MED ORDER — VILAZODONE HCL 40 MG PO TABS
40.0000 mg | ORAL_TABLET | Freq: Every day | ORAL | 1 refills | Status: DC
Start: 2022-07-19 — End: 2023-01-22

## 2022-07-19 MED ORDER — ZOLPIDEM TARTRATE ER 12.5 MG PO TBCR: 12.5000 mg | EXTENDED_RELEASE_TABLET | Freq: Every evening | ORAL | 5 refills | Status: DC | PRN

## 2022-07-19 NOTE — Progress Notes (Signed)
Alejandro Fitzgerald [id number] 1966-09-20 56 y.o.  Virtual Visit via Video Note  I connected with pt @ on 07/19/22 at  4:00 PM EDT by a video enabled telemedicine application and verified that I am speaking with the correct person using two identifiers.   I discussed the limitations of evaluation and management by telemedicine and the availability of in person appointments. The patient expressed understanding and agreed to proceed.  I discussed the assessment and treatment plan with the patient. The patient was provided an opportunity to ask questions and all were answered. The patient agreed with the plan and demonstrated an understanding of the instructions.   The patient was advised to call back or seek an in-person evaluation if the symptoms worsen or if the condition fails to improve as anticipated.  I provided 25 minutes of non-face-to-face time during this encounter.  The patient was located at home.  The provider was located at home.   Corie Chiquito, PMHNP   Subjective:   Patient ID:  Alejandro Fitzgerald is a 56 y.o. (DOB 09-Dec-1966) male.  Chief Complaint:  Chief Complaint  Patient presents with   Follow-up    Anxiety, depression, and insomnia    HPI Alejandro Fitzgerald presents for follow-up of anxiety, depression, and insomnia. He reports that his mood has been "pretty good." Denies significant depression. He reports that he tries to stay busy. He talks with sister daily for support. Enjoys seeing father. He reports that he has not missed work due to depression. He reports anxiety has been manageable.  Taking Seroquel prn. Sleeping about 5-6 hours. Harder to sleep when he works days and sleeps better when he is working nights. He reports that he occasionally is not able to fall asleep. Energy is lower at times after changing shifts. Energy and motivation have been ok. He reports some weight loss. Appetite has been less. Eating less "junk food" and drinking more water. Concentration has been  good. Denies SI.   He went to court in June and it was continued. He goes to court again in October. Unable to see his daughter. Sees his son periodically.   Works 12 hour shifts. Works 2 weeks day shift and then 2 weeks of night shifts.   Has started a new relationship.   Past Psychiatric Medication Trials: Seroquel- Helpful for insomnia Wellbutrin XL Viibryd Ambien Ambien CR Lunesta-Ineffective Lithium  Review of Systems:  Review of Systems  Cardiovascular:        Recent elevated BP and antihypertensives increased 2 days ago.  Musculoskeletal:  Negative for gait problem.       Knee pain  Neurological:  Negative for tremors.  Psychiatric/Behavioral:         Please refer to HPI    Medications: I have reviewed the patient's current medications.  Current Outpatient Medications  Medication Sig Dispense Refill   esomeprazole (NEXIUM) 40 MG capsule Take by mouth.     fluticasone (FLONASE) 50 MCG/ACT nasal spray Place into both nostrils as needed for allergies or rhinitis.     loratadine (CLARITIN) 10 MG tablet Take 10 mg by mouth daily as needed for allergies.     losartan (COZAAR) 100 MG tablet Take 100 mg by mouth daily.     meloxicam (MOBIC) 15 MG tablet Take 15 mg by mouth daily.     metoprolol succinate (TOPROL-XL) 50 MG 24 hr tablet Take 150 mg by mouth daily.     simvastatin (ZOCOR) 40 MG tablet Take 40 mg by  mouth.     buPROPion (WELLBUTRIN XL) 150 MG 24 hr tablet TAKE 3 TABLETS(450 MG) BY MOUTH DAILY 270 tablet 1   QUEtiapine (SEROQUEL) 50 MG tablet TAKE 1-3 TABLETS BY MOUTH AT BEDTIME 270 tablet 1   Vilazodone HCl (VIIBRYD) 40 MG TABS Take 1 tablet (40 mg total) by mouth daily. 90 tablet 1   [START ON 07/30/2022] zolpidem (AMBIEN CR) 12.5 MG CR tablet Take 1 tablet (12.5 mg total) by mouth at bedtime as needed for sleep. 30 tablet 5   No current facility-administered medications for this visit.    Medication Side Effects: None  Allergies:  Allergies  Allergen  Reactions   Penicillins Other (See Comments)    Unknown reaction Unknown as child     History reviewed. No pertinent past medical history.  History reviewed. No pertinent family history.  Social History   Socioeconomic History   Marital status: Married    Spouse name: Not on file   Number of children: Not on file   Years of education: Not on file   Highest education level: Not on file  Occupational History   Not on file  Tobacco Use   Smoking status: Former   Smokeless tobacco: Never  Vaping Use   Vaping Use: Never used  Substance and Sexual Activity   Alcohol use: Not Currently   Drug use: Never   Sexual activity: Not on file  Other Topics Concern   Not on file  Social History Narrative   Not on file   Social Determinants of Health   Financial Resource Strain: Not on file  Food Insecurity: Not on file  Transportation Needs: Not on file  Physical Activity: Not on file  Stress: Not on file  Social Connections: Not on file  Intimate Partner Violence: Not on file    Past Medical History, Surgical history, Social history, and Family history were reviewed and updated as appropriate.   Please see review of systems for further details on the patient's review from today.   Objective:   Physical Exam:  BP (!) 148/106   Pulse 80   Wt 190 lb (86.2 kg)   BMI 26.50 kg/m   Physical Exam Neurological:     Mental Status: He is alert and oriented to person, place, and time.     Cranial Nerves: No dysarthria.  Psychiatric:        Attention and Perception: Attention and perception normal.        Mood and Affect: Mood normal.        Speech: Speech normal.        Behavior: Behavior is cooperative.        Thought Content: Thought content normal. Thought content is not paranoid or delusional. Thought content does not include homicidal or suicidal ideation. Thought content does not include homicidal or suicidal plan.        Cognition and Memory: Cognition and memory  normal.        Judgment: Judgment normal.     Comments: Insight intact     Lab Review:     Component Value Date/Time   NA 140 10/26/2021 1449   K 4.3 10/26/2021 1449   CL 107 10/26/2021 1449   CO2 23 10/26/2021 1449   GLUCOSE 119 (H) 10/26/2021 1449   BUN 14 10/26/2021 1449   CREATININE 0.91 10/26/2021 1449   CALCIUM 9.2 10/26/2021 1449   PROT 7.0 10/26/2021 1449   AST 21 10/26/2021 1449   ALT 31 10/26/2021 1449  BILITOT 0.3 10/26/2021 1449    No results found for: "WBC", "RBC", "HGB", "HCT", "PLT", "MCV", "MCH", "MCHC", "RDW", "LYMPHSABS", "MONOABS", "EOSABS", "BASOSABS"  No results found for: "POCLITH", "LITHIUM"   No results found for: "PHENYTOIN", "PHENOBARB", "VALPROATE", "CBMZ"   .res Assessment: Plan:    Patient seen for 25 minutes and time spent discussing recent mood and anxiety symptoms.  He reports that he has been taking less Seroquel overall.  Discussed that reducing Seroquel may be assisting with weight loss.  He reports that he continues to have some occasional difficulty with sleep with changing shifts.  Discussed alternatives to Ambien CR and he reports that he has tried alternatives in the past and prefers to continue Ambien CR. Will continue Ambien CR 12.5 mg at bedtime for insomnia. Continue Viibryd 40 mg daily for anxiety and depression. Continue Wellbutrin XL 450 mg daily for depression. Continue Seroquel 50 mg 1 to 3 tablets at bedtime as needed for insomnia. Pt to follow-up in 6 months or sooner if clinically indicated.  Patient advised to contact office with any questions, adverse effects, or acute worsening in signs and symptoms.   Alejandro Fitzgerald was seen today for follow-up.  Diagnoses and all orders for this visit:  GAD (generalized anxiety disorder) -     buPROPion (WELLBUTRIN XL) 150 MG 24 hr tablet; TAKE 3 TABLETS(450 MG) BY MOUTH DAILY -     QUEtiapine (SEROQUEL) 50 MG tablet; TAKE 1-3 TABLETS BY MOUTH AT BEDTIME -     Vilazodone HCl (VIIBRYD) 40  MG TABS; Take 1 tablet (40 mg total) by mouth daily.  Circadian rhythm sleep disorder, shift work type -     QUEtiapine (SEROQUEL) 50 MG tablet; TAKE 1-3 TABLETS BY MOUTH AT BEDTIME  Insomnia, unspecified type -     zolpidem (AMBIEN CR) 12.5 MG CR tablet; Take 1 tablet (12.5 mg total) by mouth at bedtime as needed for sleep.     Please see After Visit Summary for patient specific instructions.  Future Appointments  Date Time Provider [place]  01/22/2023  1:00 PM Thayer Headings, PMHNP CP-CP None    No orders of the defined types were placed in this encounter.     -------------------------------

## 2023-01-18 ENCOUNTER — Other Ambulatory Visit: Payer: Self-pay

## 2023-01-18 ENCOUNTER — Telehealth: Payer: Self-pay | Admitting: Psychiatry

## 2023-01-18 DIAGNOSIS — G4700 Insomnia, unspecified: Secondary | ICD-10-CM

## 2023-01-18 MED ORDER — ZOLPIDEM TARTRATE ER 12.5 MG PO TBCR
12.5000 mg | EXTENDED_RELEASE_TABLET | Freq: Every evening | ORAL | 0 refills | Status: DC | PRN
Start: 2023-01-19 — End: 2023-01-22

## 2023-01-18 NOTE — Telephone Encounter (Signed)
Pt called at 3:38p.  He would like refill of Zolpidem.  He has no pills left.  Send script to   [id number] - [place], [address], [place] [address] Phone: [phone]  Fax: [phone]   Next appt 1/23

## 2023-01-18 NOTE — Telephone Encounter (Signed)
Pended.

## 2023-01-22 ENCOUNTER — Encounter: Payer: Self-pay | Admitting: Psychiatry

## 2023-01-22 ENCOUNTER — Telehealth (INDEPENDENT_AMBULATORY_CARE_PROVIDER_SITE_OTHER): Payer: 59 | Admitting: Psychiatry

## 2023-01-22 VITALS — BP 160/93 | HR 68 | Wt 193.0 lb

## 2023-01-22 DIAGNOSIS — G4726 Circadian rhythm sleep disorder, shift work type: Secondary | ICD-10-CM

## 2023-01-22 DIAGNOSIS — F3342 Major depressive disorder, recurrent, in full remission: Secondary | ICD-10-CM | POA: Diagnosis not present

## 2023-01-22 DIAGNOSIS — G4700 Insomnia, unspecified: Secondary | ICD-10-CM

## 2023-01-22 DIAGNOSIS — F411 Generalized anxiety disorder: Secondary | ICD-10-CM | POA: Diagnosis not present

## 2023-01-22 MED ORDER — QUETIAPINE FUMARATE 50 MG PO TABS
ORAL_TABLET | ORAL | 1 refills | Status: DC
Start: 2023-01-22 — End: 2023-06-26

## 2023-01-22 MED ORDER — BUPROPION HCL ER (XL) 150 MG PO TB24: ORAL_TABLET | ORAL | 1 refills | Status: DC

## 2023-01-22 MED ORDER — VILAZODONE HCL 40 MG PO TABS: 40.0000 mg | ORAL_TABLET | Freq: Every day | ORAL | 1 refills | Status: DC

## 2023-01-22 MED ORDER — ZOLPIDEM TARTRATE ER 12.5 MG PO TBCR: 12.5000 mg | EXTENDED_RELEASE_TABLET | Freq: Every evening | ORAL | 5 refills | Status: DC | PRN

## 2023-01-22 NOTE — Progress Notes (Signed)
Alejandro Fitzgerald [id number] Jun 29, 1966 57 y.o.  Virtual Visit via Video Note  I connected with pt @ on 01/22/23 at  1:00 PM EST by a video enabled telemedicine application and verified that I am speaking with the correct person using two identifiers.   I discussed the limitations of evaluation and management by telemedicine and the availability of in person appointments. The patient expressed understanding and agreed to proceed.  I discussed the assessment and treatment plan with the patient. The patient was provided an opportunity to ask questions and all were answered. The patient agreed with the plan and demonstrated an understanding of the instructions.   The patient was advised to call back or seek an in-person evaluation if the symptoms worsen or if the condition fails to improve as anticipated.  I provided 15 minutes of non-face-to-face time during this encounter.  The patient was located at home.  The provider was located at [place].   Alejandro Fitzgerald, PMHNP   Subjective:   Patient ID:  Alejandro Fitzgerald is a 57 y.o. (DOB [birth year]) male.  Chief Complaint:  Chief Complaint  Patient presents with   Follow-up    Anxiety, depression, and insomnia    HPI Alejandro Fitzgerald presents for follow-up of depression, anxiety, and insomnia. He denies any changes since last visit. Denies acute concerns. Denies depressed mood or irritability. He reports that his anxiety has been "ok." Denies any recent panic. He reports that his sleep has been "up and down." He reports that he has difficulty falling asleep with adjusting to changes in shifts. He reports that he gets adequate sleep most days and occasionally does not sleep well. He reports that he averages 5-9 hours of sleep a night. He reports that his energy and motivation have been "pretty good." He reports that he has been trying to lose or maintain weight. Appetite has been ok. Concentration has been ok. Denies SI.   Returns to  court in late March. Continues to see his son.   He is now working days only instead of 2 weeks day shifts and 2 weeks night shift. Working 12 hour shifts.   He reports taking Quetiapine only as needed for insomnia. He reports that he takes Quetiapine 1-3 tabs prn.   Ambien CR last filled 01/21/23.   Past Psychiatric Medication Trials: Seroquel- Helpful for insomnia Wellbutrin XL Viibryd Ambien Ambien CR Lunesta-Ineffective Lithium    Review of Systems:  Review of Systems  Cardiovascular:        He reports that BP has been elevated at times and plans to follow-up with PCP  Musculoskeletal:  Negative for gait problem.  Neurological:  Negative for tremors.  Psychiatric/Behavioral:         Please refer to HPI    Medications: I have reviewed the patient's current medications.  Current Outpatient Medications  Medication Sig Dispense Refill   esomeprazole (NEXIUM) 40 MG capsule Take by mouth.     fluticasone (FLONASE) 50 MCG/ACT nasal spray Place into both nostrils as needed for allergies or rhinitis.     loratadine (CLARITIN) 10 MG tablet Take 10 mg by mouth daily as needed for allergies.     losartan (COZAAR) 100 MG tablet Take 100 mg by mouth daily.     meloxicam (MOBIC) 15 MG tablet Take 15 mg by mouth daily.     metoprolol succinate (TOPROL-XL) 100 MG 24 hr tablet Take 150 mg by mouth daily.     simvastatin (ZOCOR) 40 MG tablet Take  40 mg by mouth.     buPROPion (WELLBUTRIN XL) 150 MG 24 hr tablet TAKE 3 TABLETS(450 MG) BY MOUTH DAILY 270 tablet 1   QUEtiapine (SEROQUEL) 50 MG tablet TAKE 1-3 TABLETS BY MOUTH AT BEDTIME 270 tablet 1   Vilazodone HCl (VIIBRYD) 40 MG TABS Take 1 tablet (40 mg total) by mouth daily. 90 tablet 1   [START ON 02/18/2023] zolpidem (AMBIEN CR) 12.5 MG CR tablet Take 1 tablet (12.5 mg total) by mouth at bedtime as needed for sleep. 30 tablet 5   No current facility-administered medications for this visit.    Medication Side Effects:  None  Allergies:  Allergies  Allergen Reactions   Penicillins Other (See Comments)    Unknown reaction Unknown as child     History reviewed. No pertinent past medical history.  History reviewed. No pertinent family history.  Social History   Socioeconomic History   Marital status: Married    Spouse name: Not on file   Number of children: Not on file   Years of education: Not on file   Highest education level: Not on file  Occupational History   Not on file  Tobacco Use   Smoking status: Former   Smokeless tobacco: Never  Vaping Use   Vaping Use: Never used  Substance and Sexual Activity   Alcohol use: Not Currently   Drug use: Never   Sexual activity: Not on file  Other Topics Concern   Not on file  Social History Narrative   Not on file   Social Determinants of Health   Financial Resource Strain: Not on file  Food Insecurity: Not on file  Transportation Needs: Not on file  Physical Activity: Not on file  Stress: Not on file  Social Connections: Not on file  Intimate Partner Violence: Not on file    Past Medical History, Surgical history, Social history, and Family history were reviewed and updated as appropriate.   Please see review of systems for further details on the patient's review from today.   Objective:   Physical Exam:  BP (!) 160/93   Pulse 68   Wt 193 lb (87.5 kg)   BMI 26.92 kg/m   Physical Exam Neurological:     Mental Status: He is alert and oriented to person, place, and time.     Cranial Nerves: No dysarthria.  Psychiatric:        Attention and Perception: Attention and perception normal.        Mood and Affect: Mood normal.        Speech: Speech normal.        Behavior: Behavior is cooperative.        Thought Content: Thought content normal. Thought content is not paranoid or delusional. Thought content does not include homicidal or suicidal ideation. Thought content does not include homicidal or suicidal plan.         Cognition and Memory: Cognition and memory normal.        Judgment: Judgment normal.     Comments: Insight intact     Lab Review:     Component Value Date/Time   NA 140 10/26/2021 1449   K 4.3 10/26/2021 1449   CL 107 10/26/2021 1449   CO2 23 10/26/2021 1449   GLUCOSE 119 (H) 10/26/2021 1449   BUN 14 10/26/2021 1449   CREATININE 0.91 10/26/2021 1449   CALCIUM 9.2 10/26/2021 1449   PROT 7.0 10/26/2021 1449   AST 21 10/26/2021 1449   ALT 31  10/26/2021 1449   BILITOT 0.3 10/26/2021 1449    No results found for: "WBC", "RBC", "HGB", "HCT", "PLT", "MCV", "MCH", "MCHC", "RDW", "LYMPHSABS", "MONOABS", "EOSABS", "BASOSABS"  No results found for: "POCLITH", "LITHIUM"   No results found for: "PHENYTOIN", "PHENOBARB", "VALPROATE", "CBMZ"   .res Assessment: Plan:    Pt seen for 15 minutes and time spent discussing treatment plan. He reports that overall medications remain effective for mood, anxiety, and insomnia. Encouraged pt to schedule annual physical exam since it has been almost one year since his last labs/physical exam. Discussed that Seroquel can potentially increase cholesterol and glucose and lab work is needed to monitor for possible adverse effects. Pt agrees to schedule physical exam and reports that he also plans to follow-up with PCP regarding BP not being fully controlled.  Will continue Viibryd 40 mg daily for anxiety and depression.  Continue Wellbutrin XL 450 mg daily for depression.  Continue Seroquel 50 mg 1-3 tabs at bedtime for insomnia, mood, and anxiety.  Continue Ambien CR 12.5 mg at bedtime for insomnia.  Pt to follow-up in 6 months or sooner if clinically indicated.  Patient advised to contact office with any questions, adverse effects, or acute worsening in signs and symptoms.   Fawzi was seen today for follow-up.  Diagnoses and all orders for this visit:  GAD (generalized anxiety disorder) -     buPROPion (WELLBUTRIN XL) 150 MG 24 hr tablet; TAKE 3  TABLETS(450 MG) BY MOUTH DAILY -     QUEtiapine (SEROQUEL) 50 MG tablet; TAKE 1-3 TABLETS BY MOUTH AT BEDTIME -     Vilazodone HCl (VIIBRYD) 40 MG TABS; Take 1 tablet (40 mg total) by mouth daily.  MDD (major depressive disorder), recurrent, in full remission (HCC) -     buPROPion (WELLBUTRIN XL) 150 MG 24 hr tablet; TAKE 3 TABLETS(450 MG) BY MOUTH DAILY -     Vilazodone HCl (VIIBRYD) 40 MG TABS; Take 1 tablet (40 mg total) by mouth daily.  Insomnia, unspecified type -     zolpidem (AMBIEN CR) 12.5 MG CR tablet; Take 1 tablet (12.5 mg total) by mouth at bedtime as needed for sleep.  Circadian rhythm sleep disorder, shift work type -     QUEtiapine (SEROQUEL) 50 MG tablet; TAKE 1-3 TABLETS BY MOUTH AT BEDTIME     Please see After Visit Summary for patient specific instructions.  No future appointments.   No orders of the defined types were placed in this encounter.     -------------------------------

## 2023-01-25 ENCOUNTER — Other Ambulatory Visit: Payer: Self-pay

## 2023-01-25 DIAGNOSIS — G4700 Insomnia, unspecified: Secondary | ICD-10-CM

## 2023-01-25 MED ORDER — ZOLPIDEM TARTRATE ER 12.5 MG PO TBCR
12.5000 mg | EXTENDED_RELEASE_TABLET | Freq: Every evening | ORAL | 0 refills | Status: DC | PRN
Start: 2023-01-25 — End: 2023-07-24

## 2023-06-26 ENCOUNTER — Ambulatory Visit: Payer: 59 | Admitting: Psychiatry

## 2023-06-26 ENCOUNTER — Encounter: Payer: Self-pay | Admitting: Psychiatry

## 2023-06-26 VITALS — BP 139/87 | HR 61 | Wt 188.0 lb

## 2023-06-26 DIAGNOSIS — F411 Generalized anxiety disorder: Secondary | ICD-10-CM

## 2023-06-26 DIAGNOSIS — F3342 Major depressive disorder, recurrent, in full remission: Secondary | ICD-10-CM

## 2023-06-26 DIAGNOSIS — G4700 Insomnia, unspecified: Secondary | ICD-10-CM

## 2023-06-26 MED ORDER — AUVELITY 45-105 MG PO TBCR
EXTENDED_RELEASE_TABLET | ORAL | 0 refills | Status: DC
Start: 2023-06-26 — End: 2023-07-24

## 2023-06-26 MED ORDER — GABAPENTIN 300 MG PO CAPS
ORAL_CAPSULE | ORAL | 1 refills | Status: DC
Start: 2023-06-26 — End: 2023-08-27

## 2023-06-26 MED ORDER — VILAZODONE HCL 40 MG PO TABS
40.0000 mg | ORAL_TABLET | Freq: Every day | ORAL | 1 refills | Status: DC
Start: 2023-06-26 — End: 2023-08-27

## 2023-06-26 NOTE — Progress Notes (Signed)
DMITRI TSO [id number] 06/24/1966 57 y.o.  Subjective:   Patient ID:  Alejandro Fitzgerald is a 57 y.o. (DOB March 04, 1966) male.  Chief Complaint:  Chief Complaint  Patient presents with   Anxiety   Depression   Insomnia    HPI Alejandro Fitzgerald presents to the office today for follow-up of anxiety, depression, and insomnia.   He reports, "a lot of stuff is hitting me right now." He reports that he has been having difficulty with not seeing children. He reports that he started having panic attacks on Saturday and had to leave work. Did not go to work Sunday or Monday. He reports, "before I was probably blocking things out and now everything is hitting me." He reports that he has continued to have panic attacks. He reports that he becomes tearful when pictures from the past of his children pop up on his phone. He reports that he has high anxiety- "I can never be relaxed." He reports that medications "never calm me down." He reports frequent worry and rumination. Some catastrophic thoughts.   He reports persistent depression. He reports some difficulty with falling asleep. Sleep amount varies from 3-8 hours a night. Energy and motivation have been very low- "can't get in a mood to do anything." Reports little enjoyment and interest in things- "nothing makes me happy." He reports poor focus and concentration. He reports that his appetite has been decreased and food does not taste good. Reports unintentional weight loss. He reports  suicidal thoughts for months and "then I think about others, and just can't do it yet." Contracts for safety and reports that he would let his sister or father know if he was not safe.   He reports that he feels Seroquel "puts a block in my forehead" and feels like that he is not able to think clearly and it causes headaches. He reports that Seroquel also causes nasal congestion.   He expects upcoming court date in possibly a month or so. He had a court hearing in March.    Working all day shifts. He reports that this has been an adjustment with his sleep schedule.   Past Psychiatric Medication Trials: Seroquel- Helpful for insomnia Wellbutrin XL Viibryd Ambien Ambien CR Lunesta-Ineffective Lithium   AIMS    Flowsheet Row Office Visit from 06/26/2023 in [facility] Video Visit from 01/22/2023 in [facility] Video Visit from 07/19/2022 in [facility] Office Visit from 01/18/2022 in [facility] Office Visit from 10/20/2021 in [facility]  AIMS Total Score 0 0 0 0 0        Review of Systems:  Review of Systems  Musculoskeletal:  Negative for gait problem.  Neurological:  Positive for light-headedness. Negative for tremors.  Psychiatric/Behavioral:         Please refer to HPI    Medications: I have reviewed the patient's current medications.  Current Outpatient Medications  Medication Sig Dispense Refill   amLODipine (NORVASC) 5 MG tablet Take 1 tablet by mouth daily.     Dextromethorphan-buPROPion ER (AUVELITY) 45-105 MG TBCR Take 1 tablet daily for 3 days, then increase to 1 tablet twice daily 90 tablet 0   esomeprazole (NEXIUM) 40 MG capsule Take by mouth.     fluticasone (FLONASE) 50 MCG/ACT nasal spray Place into both nostrils as needed for allergies or rhinitis.     gabapentin (NEURONTIN) 300 MG capsule Take 1-2 capsules at  bedtime for 2-3 nights, then may increase to 1 capsule in the morning and 1-2 capsules at bedtime 90 capsule 1   loratadine (CLARITIN) 10 MG tablet Take 10 mg by mouth daily as needed for allergies.     losartan (COZAAR) 100 MG tablet Take 100 mg by mouth daily.     meloxicam (MOBIC) 15 MG tablet Take 15 mg by mouth daily.     metoprolol succinate (TOPROL-XL) 100 MG 24 hr tablet Take 150 mg by mouth daily.     simvastatin (ZOCOR) 40 MG tablet Take 40 mg by mouth.     zolpidem  (AMBIEN CR) 12.5 MG CR tablet Take 1 tablet (12.5 mg total) by mouth at bedtime as needed for sleep. 30 tablet 0   Vilazodone HCl (VIIBRYD) 40 MG TABS Take 1 tablet (40 mg total) by mouth daily. 90 tablet 1   No current facility-administered medications for this visit.    Medication Side Effects: Other: Nasal congestion, slowed thoughts, headache  Allergies:  Allergies  Allergen Reactions   Penicillins Other (See Comments)    Unknown reaction Unknown as child     History reviewed. No pertinent past medical history.  Past Medical History, Surgical history, Social history, and Family history were reviewed and updated as appropriate.   Please see review of systems for further details on the patient's review from today.   Objective:   Physical Exam:  BP 139/87   Pulse 61   Wt 188 lb (85.3 kg)   BMI 26.22 kg/m   Physical Exam Constitutional:      General: He is not in acute distress. Musculoskeletal:        General: No deformity.  Neurological:     Mental Status: He is alert and oriented to person, place, and time.     Coordination: Coordination normal.  Psychiatric:        Attention and Perception: Attention and perception normal. He does not perceive auditory or visual hallucinations.        Mood and Affect: Mood is anxious and depressed. Affect is not labile, blunt, angry or inappropriate.        Speech: Speech normal.        Behavior: Behavior normal.        Thought Content: Thought content normal. Thought content is not paranoid or delusional. Thought content does not include homicidal or suicidal ideation. Thought content does not include homicidal or suicidal plan.        Cognition and Memory: Cognition and memory normal.        Judgment: Judgment normal.     Comments: Insight intact     Lab Review:     Component Value Date/Time   NA 140 10/26/2021 1449   K 4.3 10/26/2021 1449   CL 107 10/26/2021 1449   CO2 23 10/26/2021 1449   GLUCOSE 119 (H) 10/26/2021  1449   BUN 14 10/26/2021 1449   CREATININE 0.91 10/26/2021 1449   CALCIUM 9.2 10/26/2021 1449   PROT 7.0 10/26/2021 1449   AST 21 10/26/2021 1449   ALT 31 10/26/2021 1449   BILITOT 0.3 10/26/2021 1449    No results found for: "WBC", "RBC", "HGB", "HCT", "PLT", "MCV", "MCH", "MCHC", "RDW", "LYMPHSABS", "MONOABS", "EOSABS", "BASOSABS"  No results found for: "POCLITH", "LITHIUM"   No results found for: "PHENYTOIN", "PHENOBARB", "VALPROATE", "CBMZ"   .res Assessment: Plan:   Recommend leave of absence starting June 22, 2023 through July 28, 2023 with tentative return to work date of July 29, 2023.  Discussed potential benefits, risks, and side effects of Gabapentin for anxiety and insomnia. Pt agrees to trial of Gabapentin. Will start Gabapentin 300 mg 1-2 capsules at bedtime for 2-3 nights, then may increase to 1 capsule in the morning and 1-2 capsules at bedtime for anxiety and insomnia.  Discussed potential benefits, risks, and side effects of Auvelity for depression. Will start Auvelity 45-105 mg one tablet daily for 3 days, then increase Auvelity to one tablet twice daily for depression. Advised pt to stop Wellbutrin XL when starting Auvelity since Auvelity contains Bupropion.  Continue Viibryd 40 mg daily for anxiety and depression.  Continue Ambien CR 12.5 mg po at bedtime prn insomnia.  Pt to follow-up with this provider in 4 weeks or sooner if clinically indicated.  Patient advised to contact office with any questions, adverse effects, or acute worsening in signs and symptoms.   Boykin was seen today for anxiety, depression and insomnia.  Diagnoses and all orders for this visit:  Insomnia, unspecified type -     gabapentin (NEURONTIN) 300 MG capsule; Take 1-2 capsules at bedtime for 2-3 nights, then may increase to 1 capsule in the morning and 1-2 capsules at bedtime  GAD (generalized anxiety disorder) -     gabapentin (NEURONTIN) 300 MG capsule; Take 1-2 capsules at bedtime for  2-3 nights, then may increase to 1 capsule in the morning and 1-2 capsules at bedtime -     Vilazodone HCl (VIIBRYD) 40 MG TABS; Take 1 tablet (40 mg total) by mouth daily.  MDD (major depressive disorder), recurrent, in full remission (HCC) -     Dextromethorphan-buPROPion ER (AUVELITY) 45-105 MG TBCR; Take 1 tablet daily for 3 days, then increase to 1 tablet twice daily -     Vilazodone HCl (VIIBRYD) 40 MG TABS; Take 1 tablet (40 mg total) by mouth daily.     Please see After Visit Summary for patient specific instructions.  Future Appointments  Date Time Provider Department Center  07/24/2023  1:15 PM Corie Chiquito, PMHNP [facility] None  08/07/2023  1:45 PM Corie Chiquito, PMHNP [facility] None    No orders of the defined types were placed in this encounter.   -------------------------------

## 2023-07-02 ENCOUNTER — Telehealth: Payer: Self-pay | Admitting: Psychiatry

## 2023-07-02 ENCOUNTER — Telehealth: Payer: 59 | Admitting: Psychiatry

## 2023-07-02 NOTE — Telephone Encounter (Signed)
Received disability forms. Given to [facility] 7/3. Needs ASAP due date 7/17

## 2023-07-10 DIAGNOSIS — Z0289 Encounter for other administrative examinations: Secondary | ICD-10-CM

## 2023-07-10 NOTE — Telephone Encounter (Signed)
Pt called at 1p to check on status of disability forms.  I read him the note and told him to call back on Thur to see if it has been signed.

## 2023-07-10 NOTE — Telephone Encounter (Signed)
Paper work completed and given to [name] to review and sign 

## 2023-07-11 NOTE — Telephone Encounter (Signed)
These were faxed earlier today.

## 2023-07-24 ENCOUNTER — Ambulatory Visit: Payer: 59 | Admitting: Psychiatry

## 2023-07-24 ENCOUNTER — Encounter: Payer: Self-pay | Admitting: Psychiatry

## 2023-07-24 DIAGNOSIS — F332 Major depressive disorder, recurrent severe without psychotic features: Secondary | ICD-10-CM | POA: Diagnosis not present

## 2023-07-24 DIAGNOSIS — G4700 Insomnia, unspecified: Secondary | ICD-10-CM

## 2023-07-24 DIAGNOSIS — F411 Generalized anxiety disorder: Secondary | ICD-10-CM

## 2023-07-24 MED ORDER — BREXPIPRAZOLE 1 MG PO TABS
1.0000 mg | ORAL_TABLET | Freq: Every day | ORAL | Status: DC
Start: 2023-07-31 — End: 2023-08-27

## 2023-07-24 MED ORDER — REXULTI 0.5 MG PO TABS: 0.5000 mg | ORAL_TABLET | Freq: Every day | ORAL | Status: DC

## 2023-07-24 MED ORDER — AUVELITY 45-105 MG PO TBCR
1.0000 | EXTENDED_RELEASE_TABLET | Freq: Two times a day (BID) | ORAL | Status: DC
Start: 2023-07-24 — End: 2023-08-27

## 2023-07-24 MED ORDER — ZOLPIDEM TARTRATE ER 12.5 MG PO TBCR
12.5000 mg | EXTENDED_RELEASE_TABLET | Freq: Every evening | ORAL | 2 refills | Status: DC | PRN
Start: 2023-08-11 — End: 2023-08-12

## 2023-07-24 NOTE — Progress Notes (Signed)
Alejandro Fitzgerald [id number] [birth year] 57 y.o.  Subjective:   Patient ID:  Alejandro Fitzgerald is a 57 y.o. (DOB Jul 16, 1966) male.  Chief Complaint:  Chief Complaint  Patient presents with   Anxiety   Depression    Anxiety    Depression        Past medical history includes anxiety.    Alejandro Fitzgerald presents to the office today for follow-up of depression, anxiety, and insomnia. He reports that he has not experienced any improvement in anxiety with Gabapentin. He reports that he is having panic attacks daily. He reports, "worry about everything." He describes rumination about his situation.   He reports continued persistent depressed mood. He reports, "I've been down a lot."  He reports sadness in response to not seeing his children regularly. He reports that his appetite has been low and food does not taste good. He reports poor concentration and focus. He reports that sometimes he is forgetting what he was doing or about to do. He reports that his energy is very low. Motivation has been low. He reports minimal enjoyment in things. He reports trying to watch TV or look at his phone to occupy his thoughts.   He reports that his sleep has improved some since starting Gabapentin. He estimates sleeping at least 8 hours in a 24-hour period. He reports typically going to sleep around 3-5 am. Denies nightmares or dreams.   He reports experiencing daily suicidal thoughts since the first day of his separation/divorce. He reports that he has contemplated different ways to commit suicide.Denies suicidal intent because of family. Contracts for safety and reports that he will continue to reach out to his sister when experiencing suicidal thoughts.   He reports that 2 weeks ago his attorney contacted him about his case. He reports that his sister has been supportive. He is able to see his for supervised visits every 2 weeks.  He has been taking Gabapentin two capsules at bedtime. He reports that he has  been taking Auvelity once daily.   Past Psychiatric Medication Trials: Seroquel- Helpful for insomnia Wellbutrin XL Auvelity Viibryd Gabapentin Ambien Ambien CR Lunesta-Ineffective Lithium     AIMS    Flowsheet Row Office Visit from 06/26/2023 in [facility] Video Visit from 01/22/2023 in [facility] Video Visit from 07/19/2022 in [facility] Office Visit from 01/18/2022 in [facility] Office Visit from 10/20/2021 in [facility]  AIMS Total Score 0 0 0 0 0        Review of Systems:  Review of Systems  Gastrointestinal:        Reports occasional n/v and diarrhea with anxiety the week of his birthday  Musculoskeletal:  Negative for gait problem.  Neurological:  Positive for tremors.  Psychiatric/Behavioral:  Positive for depression.        Please refer to HPI    Medications: I have reviewed the patient's current medications.  Current Outpatient Medications  Medication Sig Dispense Refill   amLODipine (NORVASC) 5 MG tablet Take 1 tablet by mouth daily.     Brexpiprazole (REXULTI) 0.5 MG TABS Take 1 tablet (0.5 mg total) by mouth daily for 7 days.     [START ON 07/31/2023] brexpiprazole (REXULTI) 1 MG TABS tablet Take 1 tablet (1 mg total) by mouth daily.     esomeprazole (NEXIUM) 40 MG capsule Take by mouth.     fluticasone (FLONASE) 50 MCG/ACT nasal  spray Place into both nostrils as needed for allergies or rhinitis.     gabapentin (NEURONTIN) 300 MG capsule Take 1-2 capsules at bedtime for 2-3 nights, then may increase to 1 capsule in the morning and 1-2 capsules at bedtime 90 capsule 1   loratadine (CLARITIN) 10 MG tablet Take 10 mg by mouth daily as needed for allergies.     losartan (COZAAR) 100 MG tablet Take 100 mg by mouth daily.     meloxicam (MOBIC) 15 MG tablet Take 15 mg by mouth daily.     metoprolol succinate (TOPROL-XL)  100 MG 24 hr tablet Take 150 mg by mouth daily.     simvastatin (ZOCOR) 40 MG tablet Take 40 mg by mouth.     Vilazodone HCl (VIIBRYD) 40 MG TABS Take 1 tablet (40 mg total) by mouth daily. 90 tablet 1   Dextromethorphan-buPROPion ER (AUVELITY) 45-105 MG TBCR Take 1 tablet by mouth in the morning and at bedtime.     [START ON 08/11/2023] zolpidem (AMBIEN CR) 12.5 MG CR tablet Take 1 tablet (12.5 mg total) by mouth at bedtime as needed for sleep. 30 tablet 2   No current facility-administered medications for this visit.    Medication Side Effects: None  Allergies:  Allergies  Allergen Reactions   Penicillins Other (See Comments)    Unknown reaction Unknown as child     History reviewed. No pertinent past medical history.  Past Medical History, Surgical history, Social history, and Family history were reviewed and updated as appropriate.   Please see review of systems for further details on the patient's review from today.   Objective:   Physical Exam:  There were no vitals taken for this visit.  Physical Exam Constitutional:      General: He is not in acute distress. Musculoskeletal:        General: No deformity.  Neurological:     Mental Status: He is alert and oriented to person, place, and time.     Coordination: Coordination normal.  Psychiatric:        Attention and Perception: Attention and perception normal. He does not perceive auditory or visual hallucinations.        Mood and Affect: Mood is anxious and depressed. Affect is not labile, blunt, angry or inappropriate.        Speech: Speech normal.        Thought Content: Thought content is not paranoid or delusional. Thought content does not include homicidal ideation. Thought content does not include homicidal plan.        Cognition and Memory: Cognition and memory normal.        Judgment: Judgment normal.     Comments: Insight intact Restless He reports suicidal ideation without intent. Contracts for safety.      Lab Review:     Component Value Date/Time   NA 140 10/26/2021 1449   K 4.3 10/26/2021 1449   CL 107 10/26/2021 1449   CO2 23 10/26/2021 1449   GLUCOSE 119 (H) 10/26/2021 1449   BUN 14 10/26/2021 1449   CREATININE 0.91 10/26/2021 1449   CALCIUM 9.2 10/26/2021 1449   PROT 7.0 10/26/2021 1449   AST 21 10/26/2021 1449   ALT 31 10/26/2021 1449   BILITOT 0.3 10/26/2021 1449    No results found for: "WBC", "RBC", "HGB", "HCT", "PLT", "MCV", "MCH", "MCHC", "RDW", "LYMPHSABS", "MONOABS", "EOSABS", "BASOSABS"  No results found for: "POCLITH", "LITHIUM"   No results found for: "PHENYTOIN", "PHENOBARB", "VALPROATE", "CBMZ"   .  res Assessment: Plan:   42 minutes spent dedicated to the care of this patient on the date of this encounter to include pre-visit review of records, ordering of medication, post visit documentation, and face-to-face time with the patient discussing response to medication changes, return to work, and treatment plan. Recommend extending leave through 08/25/23 with tentative return to work on 08/26/23.  Discussed potential benefits, risks, and side effects of Rexulti. Reviewed potential metabolic side effects associated with atypical antipsychotics, as well as potential risk for movement side effects. Advised pt to contact office if movement side effects occur. Pt agrees to trial of Rexulti.  Will start Rexulti 0.5 mg daily for one week, then increase to 1 mg daily for depression.  Advised patient to increase Auvelity to twice daily since this is the typical dose for depression.  Continue Viibryd 40 mg daily for anxiety and depression.  Continue Ambien CR 12.5 mg at bedtime for insomnia.  Will continue Gabapentin 300 mg 1-2 capsules at bedtime since he reports that this has been helpful for insomnia. Pt to follow-up with this provider in 4 weeks or sooner if clinically indicated.  Patient advised to contact office with any questions, adverse effects, or acute worsening  in signs and symptoms.   Dayshaun was seen today for anxiety and depression.  Diagnoses and all orders for this visit:  Severe episode of recurrent major depressive disorder, without psychotic features (HCC) -     brexpiprazole (REXULTI) 1 MG TABS tablet; Take 1 tablet (1 mg total) by mouth daily. -     Brexpiprazole (REXULTI) 0.5 MG TABS; Take 1 tablet (0.5 mg total) by mouth daily for 7 days. -     Dextromethorphan-buPROPion ER (AUVELITY) 45-105 MG TBCR; Take 1 tablet by mouth in the morning and at bedtime.  GAD (generalized anxiety disorder)  Insomnia, unspecified type -     zolpidem (AMBIEN CR) 12.5 MG CR tablet; Take 1 tablet (12.5 mg total) by mouth at bedtime as needed for sleep.     Please see After Visit Summary for patient specific instructions.  Future Appointments  Date Time Provider Department Center  08/27/2023 12:45 PM Corie Chiquito, PMHNP [facility] None    No orders of the defined types were placed in this encounter.   -------------------------------

## 2023-07-26 DIAGNOSIS — Z0289 Encounter for other administrative examinations: Secondary | ICD-10-CM

## 2023-07-29 ENCOUNTER — Telehealth: Payer: Self-pay | Admitting: Psychiatry

## 2023-07-29 NOTE — Telephone Encounter (Signed)
Disability provider statement faxed to [facility].

## 2023-08-07 ENCOUNTER — Telehealth: Payer: 59 | Admitting: Psychiatry

## 2023-08-12 ENCOUNTER — Other Ambulatory Visit: Payer: Self-pay

## 2023-08-12 ENCOUNTER — Telehealth: Payer: Self-pay | Admitting: Psychiatry

## 2023-08-12 DIAGNOSIS — G4700 Insomnia, unspecified: Secondary | ICD-10-CM

## 2023-08-12 MED ORDER — ZOLPIDEM TARTRATE ER 12.5 MG PO TBCR
12.5000 mg | EXTENDED_RELEASE_TABLET | Freq: Every evening | ORAL | 0 refills | Status: DC | PRN
Start: 2023-08-12 — End: 2023-08-27

## 2023-08-12 NOTE — Telephone Encounter (Signed)
Canceled at [facility] and pended to [facility].

## 2023-08-12 NOTE — Telephone Encounter (Signed)
Adalid called at 2:20 to report that the [facility] doesn't have the Zolpidem in stock and they couldn't find one that did.  He did find it at [facility] at [facility], [place], [place].  Please send the prescription to the [facility]. Appt 8/27

## 2023-08-12 NOTE — Telephone Encounter (Signed)
Pt called again at 4pm asking for the same thing.

## 2023-08-27 ENCOUNTER — Encounter: Payer: Self-pay | Admitting: Psychiatry

## 2023-08-27 ENCOUNTER — Ambulatory Visit: Payer: 59 | Admitting: Psychiatry

## 2023-08-27 DIAGNOSIS — F411 Generalized anxiety disorder: Secondary | ICD-10-CM

## 2023-08-27 DIAGNOSIS — F332 Major depressive disorder, recurrent severe without psychotic features: Secondary | ICD-10-CM

## 2023-08-27 DIAGNOSIS — G4700 Insomnia, unspecified: Secondary | ICD-10-CM

## 2023-08-27 MED ORDER — ZOLPIDEM TARTRATE ER 12.5 MG PO TBCR
12.5000 mg | EXTENDED_RELEASE_TABLET | Freq: Every evening | ORAL | 5 refills | Status: DC | PRN
Start: 2023-09-09 — End: 2024-02-24

## 2023-08-27 MED ORDER — GABAPENTIN 300 MG PO CAPS
600.0000 mg | ORAL_CAPSULE | Freq: Every day | ORAL | 2 refills | Status: DC
Start: 2023-08-27 — End: 2023-10-24

## 2023-08-27 MED ORDER — CARIPRAZINE HCL 1.5 MG PO CAPS
1.5000 mg | ORAL_CAPSULE | Freq: Every day | ORAL | Status: DC
Start: 2023-08-27 — End: 2023-09-24

## 2023-08-27 MED ORDER — VILAZODONE HCL 40 MG PO TABS
40.0000 mg | ORAL_TABLET | Freq: Every day | ORAL | 1 refills | Status: DC
Start: 2023-08-27 — End: 2023-10-24

## 2023-08-27 NOTE — Progress Notes (Signed)
Alejandro Fitzgerald [id number] January 21, 1966 57 y.o.  Subjective:   Patient ID:  Alejandro Fitzgerald is a 57 y.o. (DOB 11-25-1966) male.  Chief Complaint:  Chief Complaint  Patient presents with   Depression   Anxiety    HPI Alejandro Fitzgerald presents to the office today for follow-up of depression, anxiety, and insomnia. He reports that he is "about the same." He denies any improvement in mood and reports persistent depression. He reports anxiety has been "fair."He reports worry about legal issues. He reports worry and rumination. He reports that he is unable to rest or relax. He reports having a couple of panic attacks daily that are 5-10 minutes in duration. Denies irritability.  He reports that he watches TV and has limited other activities. Low energy and motivation. He reports poor focus and concentration. He reports that he feels tired upon awakening. He reports that sleep amount ranges from 4-12 hours. He reports increased hunger at night. Appetite is lower during the day. Reports that he has not eaten yet at time of appointment (1 pm). Reports that he is not enjoying or looking forward to anything. Diminished interest.   He reports that he continues to have daily thoughts of suicide. Denies suicidal intent. He contracts for safety and reports that he would contact his sister if not feeling safe "I could reach out to her anytime." He reports that thinking about loved ones helps with suicidal thoughts.   He reports that he is not sure about when their next court date will be. He reports that he has not seen his daughter for 3-4 years. Saw his son last week. Son just started 10th grade.   Ambien CR last filled 08/13/23. Gabapentin last filled 07/25/23 x 2  Past Psychiatric Medication Trials: Seroquel- Helpful for insomnia Rexulti Wellbutrin XL Auvelity Viibryd Gabapentin Ambien Ambien CR Lunesta-Ineffective Lithium  AIMS    Flowsheet Row Office Visit from 08/27/2023 in [facility] Office Visit from 06/26/2023 in [facility] Video Visit from 01/22/2023 in [facility] Video Visit from 07/19/2022 in [facility] Office Visit from 01/18/2022 in [facility]  AIMS Total Score 0 0 0 0 0      GAD-7    Flowsheet Row Office Visit from 08/27/2023 in [facility]  Total GAD-7 Score 8      PHQ2-9    Flowsheet Row Office Visit from 08/27/2023 in [facility]  PHQ-2 Total Score 6  PHQ-9 Total Score 22        Review of Systems:  Review of Systems  Musculoskeletal:  Negative for gait problem.       He reports that his left leg periodically "gives out."  Neurological:  Positive for dizziness. Negative for tremors.  Psychiatric/Behavioral:         Please refer to HPI    Medications: I have reviewed the patient's current medications.  Current Outpatient Medications  Medication Sig Dispense Refill   amLODipine (NORVASC) 5 MG tablet Take 1 tablet by mouth daily.     cariprazine (VRAYLAR) 1.5 MG capsule Take 1 capsule (1.5 mg total) by mouth daily.     esomeprazole (NEXIUM) 40 MG capsule Take by mouth.     fluticasone (FLONASE) 50 MCG/ACT nasal spray Place into both nostrils as needed for allergies or rhinitis.     loratadine (CLARITIN) 10 MG tablet Take 10 mg by mouth daily  as needed for allergies.     losartan (COZAAR) 100 MG tablet Take 100 mg by mouth daily.     meloxicam (MOBIC) 15 MG tablet Take 15 mg by mouth daily.     metoprolol succinate (TOPROL-XL) 100 MG 24 hr tablet Take 150 mg by mouth daily.     simvastatin (ZOCOR) 40 MG tablet Take 40 mg by mouth.     gabapentin (NEURONTIN) 300 MG capsule Take 2 capsules (600 mg total) by mouth at bedtime. 60 capsule 2   Vilazodone HCl (VIIBRYD) 40 MG TABS Take 1 tablet (40 mg total) by mouth daily. 90 tablet 1   [START ON 09/09/2023] zolpidem  (AMBIEN CR) 12.5 MG CR tablet Take 1 tablet (12.5 mg total) by mouth at bedtime as needed for sleep. 30 tablet 5   No current facility-administered medications for this visit.    Medication Side Effects: Other: Possible weight gain  Allergies:  Allergies  Allergen Reactions   Penicillins Other (See Comments)    Unknown reaction Unknown as child     History reviewed. No pertinent past medical history.  Past Medical History, Surgical history, Social history, and Family history were reviewed and updated as appropriate.   Please see review of systems for further details on the patient's review from today.   Objective:   Physical Exam:  BP (!) 148/87   Pulse 60   Physical Exam Constitutional:      General: He is not in acute distress. Musculoskeletal:        General: No deformity.  Neurological:     Mental Status: He is alert and oriented to person, place, and time.     Coordination: Coordination normal.  Psychiatric:        Attention and Perception: Attention and perception normal. He does not perceive auditory or visual hallucinations.        Mood and Affect: Mood is anxious and depressed. Affect is not labile, blunt, angry or inappropriate.        Speech: Speech normal.        Behavior: Behavior is slowed. Behavior is cooperative.        Thought Content: Thought content is not paranoid or delusional. Thought content does not include homicidal ideation. Thought content does not include homicidal plan.        Cognition and Memory: Cognition and memory normal.        Judgment: Judgment normal.     Comments: Insight intact Tearful at times on exam Reports suicidal thoughts without intent at this time. Contracts for safety.      Lab Review:     Component Value Date/Time   NA 140 10/26/2021 1449   K 4.3 10/26/2021 1449   CL 107 10/26/2021 1449   CO2 23 10/26/2021 1449   GLUCOSE 119 (H) 10/26/2021 1449   BUN 14 10/26/2021 1449   CREATININE 0.91 10/26/2021 1449    CALCIUM 9.2 10/26/2021 1449   PROT 7.0 10/26/2021 1449   AST 21 10/26/2021 1449   ALT 31 10/26/2021 1449   BILITOT 0.3 10/26/2021 1449    No results found for: "WBC", "RBC", "HGB", "HCT", "PLT", "MCV", "MCH", "MCHC", "RDW", "LYMPHSABS", "MONOABS", "EOSABS", "BASOSABS"  No results found for: "POCLITH", "LITHIUM"   No results found for: "PHENYTOIN", "PHENOBARB", "VALPROATE", "CBMZ"   .res Assessment: Plan:   38 minutes spent dedicated to the care of this patient on the date of this encounter to include pre-visit review of records, ordering of medication, post visit documentation, and face-to-face  time with the patient discussing treatment plan, extending leave of absence, and discussing potential benefits, risks, and side effects of Vraylar.  Recommend extending leave of absence through 09/29/23 with tentative return to work date of 09/30/23.  Will discontinue Rexulti due to being ineffective and experiencing some weight gain.  Will start Vraylar 1.5 mg daily for augmentation of depression. Will discontinue Auvelity due to no improvement in depression.  Continue Viibryd 40 mg daily for anxiety and depression.  Continue Gabapentin 600 mg at bedtime for anxiety and insomnia.  Continue Ambien CR 12.5 mg at bedtime as needed for insomnia.  Declines therapy at this time. He reports that he may consider starting therapy in the future if depression does not improve with Vraylar.  Pt to follow-up with this provider in 4 weeks or sooner if clinically indicated.  Patient advised to contact office with any questions, adverse effects, or acute worsening in signs and symptoms.   Ranferi was seen today for depression and anxiety.  Diagnoses and all orders for this visit:  Severe episode of recurrent major depressive disorder, without psychotic features (HCC) -     cariprazine (VRAYLAR) 1.5 MG capsule; Take 1 capsule (1.5 mg total) by mouth daily. -     Vilazodone HCl (VIIBRYD) 40 MG TABS; Take 1 tablet  (40 mg total) by mouth daily.  GAD (generalized anxiety disorder) -     Vilazodone HCl (VIIBRYD) 40 MG TABS; Take 1 tablet (40 mg total) by mouth daily. -     gabapentin (NEURONTIN) 300 MG capsule; Take 2 capsules (600 mg total) by mouth at bedtime.  Insomnia, unspecified type -     zolpidem (AMBIEN CR) 12.5 MG CR tablet; Take 1 tablet (12.5 mg total) by mouth at bedtime as needed for sleep. -     gabapentin (NEURONTIN) 300 MG capsule; Take 2 capsules (600 mg total) by mouth at bedtime.     Please see After Visit Summary for patient specific instructions.  Future Appointments  Date Time Provider Department Center  09/24/2023  1:15 PM Corie Chiquito, PMHNP [facility] None    No orders of the defined types were placed in this encounter.   -------------------------------

## 2023-09-24 ENCOUNTER — Encounter: Payer: Self-pay | Admitting: Psychiatry

## 2023-09-24 ENCOUNTER — Other Ambulatory Visit: Payer: Self-pay | Admitting: Psychiatry

## 2023-09-24 ENCOUNTER — Ambulatory Visit: Payer: 59 | Admitting: Psychiatry

## 2023-09-24 DIAGNOSIS — F332 Major depressive disorder, recurrent severe without psychotic features: Secondary | ICD-10-CM

## 2023-09-24 DIAGNOSIS — G4700 Insomnia, unspecified: Secondary | ICD-10-CM

## 2023-09-24 DIAGNOSIS — F411 Generalized anxiety disorder: Secondary | ICD-10-CM | POA: Diagnosis not present

## 2023-09-24 MED ORDER — ASENAPINE MALEATE 5 MG SL SUBL
5.0000 mg | SUBLINGUAL_TABLET | Freq: Every day | SUBLINGUAL | 1 refills | Status: DC
Start: 2023-09-24 — End: 2023-09-24

## 2023-09-24 NOTE — Patient Instructions (Signed)
[email or web address]  [id number]

## 2023-09-27 MED ORDER — ASENAPINE MALEATE 5 MG SL SUBL
5.0000 mg | SUBLINGUAL_TABLET | Freq: Every day | SUBLINGUAL | 1 refills | Status: DC
Start: 2023-09-27 — End: 2023-10-24

## 2023-10-02 ENCOUNTER — Telehealth: Payer: Self-pay | Admitting: Psychiatry

## 2023-10-02 NOTE — Telephone Encounter (Signed)
Received disability forms. Placed in Traci's box 10/2

## 2023-10-07 NOTE — Telephone Encounter (Signed)
Pt called checking the status of his paperwork . Please call him at [phone]

## 2023-10-07 NOTE — Telephone Encounter (Signed)
I have been out of office since 10/01, will get paper work completed this week.

## 2023-10-10 NOTE — Telephone Encounter (Signed)
PT would like to know the status of his paperwork.

## 2023-10-10 NOTE — Telephone Encounter (Signed)
Patient lvm today at 2:50 checking on the status of his paperwork. Appointment scheduled for 10/24/23.  Contact # [phone]

## 2023-10-11 NOTE — Telephone Encounter (Signed)
The only paper work I received was asking for the last office note. This was faxed per request. No forms requested

## 2023-10-11 NOTE — Telephone Encounter (Signed)
Pt has been told I have been out of the office since 10/01/2023 and that it would be completed as soon as possible and was told it would be by Friday 10/12/2023.

## 2023-10-11 NOTE — Telephone Encounter (Signed)
Pt paid on 10/02/23

## 2023-10-24 ENCOUNTER — Encounter: Payer: Self-pay | Admitting: Psychiatry

## 2023-10-24 ENCOUNTER — Ambulatory Visit: Payer: 59 | Admitting: Psychiatry

## 2023-10-24 DIAGNOSIS — F332 Major depressive disorder, recurrent severe without psychotic features: Secondary | ICD-10-CM | POA: Diagnosis not present

## 2023-10-24 DIAGNOSIS — G4700 Insomnia, unspecified: Secondary | ICD-10-CM | POA: Diagnosis not present

## 2023-10-24 DIAGNOSIS — F411 Generalized anxiety disorder: Secondary | ICD-10-CM | POA: Diagnosis not present

## 2023-10-24 MED ORDER — ASENAPINE MALEATE 5 MG SL SUBL: 5.0000 mg | SUBLINGUAL_TABLET | Freq: Every day | SUBLINGUAL | 1 refills | Status: DC

## 2023-10-24 MED ORDER — VILAZODONE HCL 40 MG PO TABS
40.0000 mg | ORAL_TABLET | Freq: Every day | ORAL | 1 refills | Status: DC
Start: 2023-10-24 — End: 2023-11-25

## 2023-10-24 MED ORDER — GABAPENTIN 300 MG PO CAPS: 600.0000 mg | ORAL_CAPSULE | Freq: Every day | ORAL | 2 refills | Status: DC
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
## 2023-11-13 ENCOUNTER — Encounter: Payer: Self-pay | Admitting: Psychiatry

## 2023-11-25 ENCOUNTER — Ambulatory Visit: Payer: 59 | Admitting: Psychiatry

## 2023-11-25 ENCOUNTER — Encounter: Payer: Self-pay | Admitting: Psychiatry

## 2023-11-25 DIAGNOSIS — G4700 Insomnia, unspecified: Secondary | ICD-10-CM

## 2023-11-25 DIAGNOSIS — F411 Generalized anxiety disorder: Secondary | ICD-10-CM

## 2023-11-25 DIAGNOSIS — F332 Major depressive disorder, recurrent severe without psychotic features: Secondary | ICD-10-CM

## 2023-11-25 MED ORDER — CARIPRAZINE HCL 1.5 MG PO CAPS: 1.5000 mg | ORAL_CAPSULE | Freq: Every day | ORAL | 0 refills | Status: DC

## 2023-11-25 MED ORDER — VILAZODONE HCL 40 MG PO TABS
40.0000 mg | ORAL_TABLET | Freq: Every day | ORAL | 1 refills | Status: AC
Start: 2023-11-25 — End: ?

## 2023-11-25 MED ORDER — ASENAPINE MALEATE 5 MG SL SUBL
5.0000 mg | SUBLINGUAL_TABLET | Freq: Every evening | SUBLINGUAL | Status: DC | PRN
Start: 2023-11-25 — End: 2023-12-19

## 2023-11-25 MED ORDER — BUPROPION HCL ER (XL) 150 MG PO TB24: 150.0000 mg | ORAL_TABLET | Freq: Every day | ORAL | 0 refills | Status: DC

## 2023-11-25 MED ORDER — CARIPRAZINE HCL 1.5 MG PO CAPS
1.5000 mg | ORAL_CAPSULE | Freq: Every day | ORAL | Status: DC
Start: 2023-11-25 — End: 2023-12-19

## 2023-11-25 NOTE — Progress Notes (Signed)
Alejandro Fitzgerald [id number] 1966-03-24 57 y.o.  Subjective:   Patient ID:  Alejandro Fitzgerald is a 57 y.o. (DOB 1966-08-16) male.  Chief Complaint:  Chief Complaint  Patient presents with   Depression   Anxiety   Insomnia    HPI Alejandro Fitzgerald presents to the office today for follow-up of depression, anxiety, and insomnia. He reports that he has "had a few breakdowns" with crying. He reports that this is triggered by thinking about losing family members.   He reports that he is not sure about benefit with Asenapine. He reports that Asenapine has been helpful for insomnia. He reports sleeping about 7-8 hours compared to 6  hours with Ambien CR alone. He reports that he did not fall asleep until 5 am today and slept until 11:30 am. He reports that he re-started Wellbutrin XL 150 mg daily a few days ago to possibly improve his mood since he feels that mood has been more depressed. Reports persistent sad mood. Denies irritability. His anxiety has been "up and down." He has periods of increased anxiety. He reports frequent worry and rumination. Energy and motivation have been fair- "not up and not down." Appetite has been "good." He reports mainly eating at night with some snacks during the day. He reports difficulty with concentration and reports that sometimes he is not able to recall what he was doing.   He reports that he has had suicidal thoughts and has "lightly" cut his wrists at time. He reports increased suicidal thoughts. He reports chronic suicidal thoughts. He reports that he has been calling his sister when SI occurs and then SI improves. He also will call his dad. Contracts for safety and reports that he will continue to reach out to family when he experiences SI. "I don't want to die."   He reports that he spends most of his time at home aside from occasionally visiting family.   He does not know when his next court date will be.   Father's health has improved since hospitalization.    Son is doing well. Reports that his children are doing well academically.     Past Psychiatric Medication Trials: Seroquel- Helpful for insomnia Abilify Rexulti Vraylar-Ineffective Wellbutrin XL Auvelity- no improvement Viibryd- helpful for anxiety and depression. Gabapentin Ambien Ambien CR Lunesta-Ineffective Klonopin- reports effective for insomnia in the past.  Lithium     AIMS    Flowsheet Row Office Visit from 10/24/2023 in [facility] Office Visit from 08/27/2023 in [facility] Office Visit from 06/26/2023 in [facility] Video Visit from 01/22/2023 in [facility] Video Visit from 07/19/2022 in [facility]  AIMS Total Score 0 0 0 0 0      GAD-7    Flowsheet Row Office Visit from 08/27/2023 in [facility]  Total GAD-7 Score 8      PHQ2-9    Flowsheet Row Office Visit from 08/27/2023 in [facility]  PHQ-2 Total Score 6  PHQ-9 Total Score 22        Review of Systems:  Review of Systems  HENT:  Positive for dental problem.        Has been having dental work and plans to have crowns put in 12/03/23.  Gastrointestinal: Negative.   Musculoskeletal:  Negative for gait problem.  Neurological:  Negative for tremors and headaches.  Psychiatric/Behavioral:  Please refer to HPI    Medications: I have reviewed the patient's current medications.  Current Outpatient Medications  Medication Sig Dispense Refill   amLODipine (NORVASC) 5 MG tablet Take 1 tablet by mouth daily.     cariprazine (VRAYLAR) 1.5 MG capsule Take 1 capsule (1.5 mg total) by mouth daily. 90 capsule 0   esomeprazole (NEXIUM) 40 MG capsule Take by mouth.     fluticasone (FLONASE) 50 MCG/ACT nasal spray Place into both nostrils as needed for allergies or rhinitis.     [START ON 11/28/2023]  gabapentin (NEURONTIN) 300 MG capsule Take 2 capsules (600 mg total) by mouth at bedtime. 60 capsule 2   losartan (COZAAR) 100 MG tablet Take 100 mg by mouth daily.     meloxicam (MOBIC) 15 MG tablet Take 15 mg by mouth daily.     metoprolol succinate (TOPROL-XL) 100 MG 24 hr tablet Take 150 mg by mouth daily.     simvastatin (ZOCOR) 40 MG tablet Take 40 mg by mouth.     zolpidem (AMBIEN CR) 12.5 MG CR tablet Take 1 tablet (12.5 mg total) by mouth at bedtime as needed for sleep. 30 tablet 5   asenapine (SAPHRIS) 5 MG SUBL 24 hr tablet Place 1 tablet (5 mg total) under the tongue at bedtime as needed.     buPROPion (WELLBUTRIN XL) 150 MG 24 hr tablet Take 1 tablet (150 mg total) by mouth daily. 90 tablet 0   cariprazine (VRAYLAR) 1.5 MG capsule Take 1 capsule (1.5 mg total) by mouth daily.     loratadine (CLARITIN) 10 MG tablet Take 10 mg by mouth daily as needed for allergies.     Vilazodone HCl (VIIBRYD) 40 MG TABS Take 1 tablet (40 mg total) by mouth daily. 90 tablet 1   No current facility-administered medications for this visit.    Medication Side Effects: None  Allergies:  Allergies  Allergen Reactions   Penicillins Other (See Comments)    Unknown reaction Unknown as child     History reviewed. No pertinent past medical history.  Past Medical History, Surgical history, Social history, and Family history were reviewed and updated as appropriate.   Please see review of systems for further details on the patient's review from today.   Objective:   Physical Exam:  There were no vitals taken for this visit.  Physical Exam Constitutional:      General: He is not in acute distress. Musculoskeletal:        General: No deformity.  Neurological:     Mental Status: He is alert and oriented to person, place, and time.     Coordination: Coordination normal.  Psychiatric:        Attention and Perception: Attention and perception normal. He does not perceive auditory or visual  hallucinations.        Mood and Affect: Mood is anxious and depressed. Affect is flat. Affect is not labile, blunt, angry or inappropriate.        Behavior: Behavior is cooperative.        Thought Content: Thought content is not paranoid or delusional. Thought content includes suicidal ideation. Thought content does not include homicidal ideation. Thought content does not include homicidal or suicidal plan.        Cognition and Memory: Cognition and memory normal.        Judgment: Judgment normal.     Comments: Insight intact Speech is soft and monotone. Pt reports chronic suicidal thoughts. Denies suicidal intent at time  of exam. He contracts for safety. Forward thinking- ie., talks about upcoming dental work, meeting deductible in the new year, etc.     Lab Review:     Component Value Date/Time   NA 140 10/26/2021 1449   K 4.3 10/26/2021 1449   CL 107 10/26/2021 1449   CO2 23 10/26/2021 1449   GLUCOSE 119 (H) 10/26/2021 1449   BUN 14 10/26/2021 1449   CREATININE 0.91 10/26/2021 1449   CALCIUM 9.2 10/26/2021 1449   PROT 7.0 10/26/2021 1449   AST 21 10/26/2021 1449   ALT 31 10/26/2021 1449   BILITOT 0.3 10/26/2021 1449    No results found for: "WBC", "RBC", "HGB", "HCT", "PLT", "MCV", "MCH", "MCHC", "RDW", "LYMPHSABS", "MONOABS", "EOSABS", "BASOSABS"  No results found for: "POCLITH", "LITHIUM"   No results found for: "PHENYTOIN", "PHENOBARB", "VALPROATE", "CBMZ"   .res Assessment: Plan:    Recommend extending leave through 12/30/23 with tentative return to work date of 12/31/23.  Discussed Lamictal. Pt declines, reporting that he prefers to take a medication that does not need to be titrated and may have more immediate benefit. He reports that Vraylar may have been slightly helpful and that increased restlessness was most likely caffeine related instead of Vraylar. Will re-start Vraylar 1.5 mg daily for augmentation of depression. Pt provided with samples and script sent to  pharmacy.  Agree with re-starting Wellbutrin XL 150 mg daily since he reports that this may have been helpful for his depression in the past.  He reports that he would like to continue Viibryd 40 mg daily for depression and anxiety since Viibryd has seemed to be helpful for target symptoms.  Discussed discontinuing Asenapine 5 mg SL if he does not experience worsening insomnia.  Continue Gabapentin 600 mg at bedtime for anxiety and insomnia.  Continue Ambien CR 12.5 mg at bedtime for insomnia.  Pt to follow-up with this provider in 3-4 weeks or sooner if clinically indicated.  Patient advised to contact office with any questions, adverse effects, or acute worsening in signs and symptoms.   Jaboris was seen today for depression, anxiety and insomnia.  Diagnoses and all orders for this visit:  Severe episode of recurrent major depressive disorder, without psychotic features (HCC) -     cariprazine (VRAYLAR) 1.5 MG capsule; Take 1 capsule (1.5 mg total) by mouth daily. -     cariprazine (VRAYLAR) 1.5 MG capsule; Take 1 capsule (1.5 mg total) by mouth daily. -     asenapine (SAPHRIS) 5 MG SUBL 24 hr tablet; Place 1 tablet (5 mg total) under the tongue at bedtime as needed. -     Vilazodone HCl (VIIBRYD) 40 MG TABS; Take 1 tablet (40 mg total) by mouth daily. -     buPROPion (WELLBUTRIN XL) 150 MG 24 hr tablet; Take 1 tablet (150 mg total) by mouth daily.  Insomnia, unspecified type -     asenapine (SAPHRIS) 5 MG SUBL 24 hr tablet; Place 1 tablet (5 mg total) under the tongue at bedtime as needed.  GAD (generalized anxiety disorder) -     asenapine (SAPHRIS) 5 MG SUBL 24 hr tablet; Place 1 tablet (5 mg total) under the tongue at bedtime as needed. -     Vilazodone HCl (VIIBRYD) 40 MG TABS; Take 1 tablet (40 mg total) by mouth daily.     Please see After Visit Summary for patient specific instructions.  Future Appointments  Date Time Provider Department Center  12/19/2023  8:30 AM Corie Chiquito, PMHNP  CP-CP None    No orders of the defined types were placed in this encounter.   -------------------------------

## 2023-11-26 ENCOUNTER — Telehealth: Payer: Self-pay | Admitting: Psychiatry

## 2023-11-26 NOTE — Telephone Encounter (Signed)
Pt called at 4:12 to pay to have disability paperwork updated from his visit on yesterday, 11/25 sent to [facility].  Next appt 12/19

## 2023-11-26 NOTE — Telephone Encounter (Signed)
Off Manager will send most recent office note to [facility] [employer] for review. Previous forms and signed consent on file. Attached to previous medical record request.

## 2023-11-27 NOTE — Telephone Encounter (Signed)
Noted! Thank you

## 2023-12-19 ENCOUNTER — Ambulatory Visit: Payer: 59 | Admitting: Psychiatry

## 2023-12-19 ENCOUNTER — Encounter: Payer: Self-pay | Admitting: Psychiatry

## 2023-12-19 DIAGNOSIS — G4700 Insomnia, unspecified: Secondary | ICD-10-CM

## 2023-12-19 DIAGNOSIS — F332 Major depressive disorder, recurrent severe without psychotic features: Secondary | ICD-10-CM

## 2023-12-19 DIAGNOSIS — F411 Generalized anxiety disorder: Secondary | ICD-10-CM | POA: Diagnosis not present

## 2023-12-19 MED ORDER — LAMOTRIGINE 100 MG PO TABS: ORAL_TABLET | ORAL | 0 refills | Status: DC

## 2023-12-19 MED ORDER — GABAPENTIN 300 MG PO CAPS: ORAL_CAPSULE | ORAL | 2 refills | Status: DC

## 2023-12-19 MED ORDER — LAMOTRIGINE 25 MG PO TABS: ORAL_TABLET | ORAL | 0 refills | Status: DC

## 2023-12-19 MED ORDER — BUPROPION HCL ER (XL) 150 MG PO TB24: 450.0000 mg | ORAL_TABLET | Freq: Every day | ORAL | 0 refills | Status: DC

## 2023-12-19 NOTE — Progress Notes (Signed)
Alejandro Fitzgerald [id number] [birth year] 57 y.o.  Subjective:   Patient ID:  Alejandro Fitzgerald is a 57 y.o. (DOB 30-Dec-1966) male.  Chief Complaint:  Chief Complaint  Patient presents with   Depression   Anxiety   Follow-up    Insomnia    HPI Alejandro Fitzgerald presents to the office today for follow-up of depression, anxiety, and insomnia. He reports that Vraylar caused restless legs/ muscle aches and decreased appetite, and he stopped it after several days. He stopped taking Asenapine about 6 days ago due to weight gain. He reports that he has "just been breaking down a lot" and will cry when he thinks about certain things or sees certain things on TV. He reports persistent sad mood. He reports feeling, "like I'm in a zombie zone." He reports low motivation and low energy. Anxiety is "up and down." He reports panic attacks on a daily basis. He reports worry and rumination. He reports that sleep is "alright" with Ambien CR. Reports sleeping 6-8 hours after "trying to wear myself out" and eventually falling asleep around 3-4 am most nights. Eating excessive at times. He describes concentration as "about half." He reports chronic suicidal thoughts- "but I try to shake myself off of that" and redirect thoughts. Denies suicidal intent. Contracts for safety.   Continues to talk to sister on a daily basis and communicates with her about how he feels. Plans to spend holidays with his sister and his father.  Reports that he is not sure about benefit with Gabapentin.   He reports that he increased Wellbutrin XL up to 450 mg since this was his previous dose.   Past Psychiatric Medication Trials: Seroquel- Helpful for insomnia Saphris-Helpful for insomnia. Caused weight gain.  Abilify Rexulti Vraylar-Ineffective. Muscle soreness Wellbutrin XL Auvelity- no improvement Viibryd- helpful for anxiety and depression. Gabapentin Ambien Ambien CR Lunesta-Ineffective Klonopin- reports effective for insomnia  in the past.  Lithium- no improvement  AIMS    Flowsheet Row Office Visit from 12/19/2023 in [facility] Office Visit from 10/24/2023 in [facility] Office Visit from 08/27/2023 in [facility] Office Visit from 06/26/2023 in [facility] Video Visit from 01/22/2023 in [facility]  AIMS Total Score 0 0 0 0 0      GAD-7    Flowsheet Row Office Visit from 08/27/2023 in [facility]  Total GAD-7 Score 8      PHQ2-9    Flowsheet Row Office Visit from 08/27/2023 in [facility]  PHQ-2 Total Score 6  PHQ-9 Total Score 22        Review of Systems:  Review of Systems  HENT:  Positive for congestion.        Reports recurrent sinus infections  Musculoskeletal:  Negative for gait problem.  Neurological:  Negative for tremors.  Psychiatric/Behavioral:         Please refer to HPI    Medications: I have reviewed the patient's current medications.  Current Outpatient Medications  Medication Sig Dispense Refill   amLODipine (NORVASC) 5 MG tablet Take 1 tablet by mouth daily.     esomeprazole (NEXIUM) 40 MG capsule Take by mouth.     fluticasone (FLONASE) 50 MCG/ACT nasal spray Place into both nostrils as needed for allergies or rhinitis.     [START ON 01/16/2024] lamoTRIgine (LAMICTAL) 100 MG tablet Take 1 tab po qd x 2 weeks, then  increase to 1.5 tabs po qd 45 tablet 0   lamoTRIgine (LAMICTAL) 25 MG tablet Take 1 tablet (25 mg total) by mouth daily for 14 days, THEN 2 tablets (50 mg total) daily for 14 days. 45 tablet 0   loratadine (CLARITIN) 10 MG tablet Take 10 mg by mouth daily as needed for allergies.     losartan (COZAAR) 100 MG tablet Take 100 mg by mouth daily.     meloxicam (MOBIC) 15 MG tablet Take 15 mg by mouth daily.     metoprolol succinate (TOPROL-XL) 100 MG 24 hr tablet Take 150  mg by mouth daily.     simvastatin (ZOCOR) 40 MG tablet Take 40 mg by mouth.     Vilazodone HCl (VIIBRYD) 40 MG TABS Take 1 tablet (40 mg total) by mouth daily. 90 tablet 1   zolpidem (AMBIEN CR) 12.5 MG CR tablet Take 1 tablet (12.5 mg total) by mouth at bedtime as needed for sleep. 30 tablet 5   buPROPion (WELLBUTRIN XL) 150 MG 24 hr tablet Take 3 tablets (450 mg total) by mouth daily. 270 tablet 0   gabapentin (NEURONTIN) 300 MG capsule Take 1-2 capsules in the evening 60 capsule 2   No current facility-administered medications for this visit.    Medication Side Effects: Other: Appetite changes with medications.   Allergies:  Allergies  Allergen Reactions   Penicillins Other (See Comments)    Unknown reaction Unknown as child     History reviewed. No pertinent past medical history.  Past Medical History, Surgical history, Social history, and Family history were reviewed and updated as appropriate.   Please see review of systems for further details on the patient's review from today.   Objective:   Physical Exam:  Wt 208 lb (94.3 kg)   BMI 29.01 kg/m   Physical Exam Constitutional:      General: He is not in acute distress. Musculoskeletal:        General: No deformity.  Neurological:     Mental Status: He is alert and oriented to person, place, and time.     Coordination: Coordination normal.  Psychiatric:        Attention and Perception: Attention and perception normal. He does not perceive auditory or visual hallucinations.        Mood and Affect: Mood is anxious and depressed. Affect is not labile, blunt, angry or inappropriate.        Speech: Speech normal.        Behavior: Behavior normal.        Thought Content: Thought content normal. Thought content is not paranoid or delusional. Thought content does not include homicidal or suicidal ideation. Thought content does not include homicidal or suicidal plan.        Cognition and Memory: Cognition and memory  normal.        Judgment: Judgment normal.     Comments: Insight intact     Lab Review:     Component Value Date/Time   NA 140 10/26/2021 1449   K 4.3 10/26/2021 1449   CL 107 10/26/2021 1449   CO2 23 10/26/2021 1449   GLUCOSE 119 (H) 10/26/2021 1449   BUN 14 10/26/2021 1449   CREATININE 0.91 10/26/2021 1449   CALCIUM 9.2 10/26/2021 1449   PROT 7.0 10/26/2021 1449   AST 21 10/26/2021 1449   ALT 31 10/26/2021 1449   BILITOT 0.3 10/26/2021 1449    No results found for: "WBC", "RBC", "HGB", "HCT", "PLT", "MCV", "MCH", "  MCHC", "RDW", "LYMPHSABS", "MONOABS", "EOSABS", "BASOSABS"  No results found for: "POCLITH", "LITHIUM"   No results found for: "PHENYTOIN", "PHENOBARB", "VALPROATE", "CBMZ"   .res Assessment: Plan:   35 minutes spent dedicated to the care of this patient on the date of this encounter to include pre-visit review of records, ordering of medication, post visit documentation, and face-to-face time with the patient discussing response to medications and treatment plan. Will discontinue Vraylar due to restless legs and muscle aches. Will discontinue Asenapine due to weight gain.  Discussed potential benefits, risks, and side effects of treatment options and discussed that he has had tolerability issues with multiple atypical antipsychotics for augmentation of depression. Discussed considering other treatment options for augmentation of depression, including off-label treatment options. Reviewed potential benefits, risks, and side effects of Lamictal to include potential risk of Stevens-Johnson syndrome. Advised patient to stop taking Lamictal and contact office immediately if rash develops and to seek urgent medical attention if rash is severe and/or spreading quickly. Will start Lamictal 25 mg daily for 2 weeks, then increase to 50 mg daily for 2 weeks, then 100 mg daily for 2 weeks, then 150 mg daily for mood symptoms.  Will continue Viibryd 40 mg daily since pt reports that  he has experienced some benefit with Viibryd in terms of mood and anxiety.  Pt reports that he increased Wellbutrin XL from 150 mg to previous dose of 450 mg daily to improve depression. Will continue Wellbutrin XL 450 mg daily.  Will continue Ambien CR 12.5 mg po at bedtime prn insomnia.  Recommend extending leave through 01/28/24 with tentative return to work date of 01/29/24.  Discussed transferring care to Avelina Laine, NP since this provider will be leaving the practice. Pt scheduled to see Avelina Laine, NP on 01/27/24. Patient advised to contact office with any questions, adverse effects, or acute worsening in signs and symptoms.   Alejandro Fitzgerald was seen today for depression, anxiety and follow-up.  Diagnoses and all orders for this visit:  Severe episode of recurrent major depressive disorder, without psychotic features (HCC) -     lamoTRIgine (LAMICTAL) 25 MG tablet; Take 1 tablet (25 mg total) by mouth daily for 14 days, THEN 2 tablets (50 mg total) daily for 14 days. -     lamoTRIgine (LAMICTAL) 100 MG tablet; Take 1 tab po qd x 2 weeks, then increase to 1.5 tabs po qd -     buPROPion (WELLBUTRIN XL) 150 MG 24 hr tablet; Take 3 tablets (450 mg total) by mouth daily.  Insomnia, unspecified type -     gabapentin (NEURONTIN) 300 MG capsule; Take 1-2 capsules in the evening  GAD (generalized anxiety disorder) -     gabapentin (NEURONTIN) 300 MG capsule; Take 1-2 capsules in the evening     Please see After Visit Summary for patient specific instructions.  Future Appointments  Date Time Provider Department Center  01/27/2024  3:15 PM Avelina Laine A, NP CP-CP None     No orders of the defined types were placed in this encounter.   -------------------------------

## 2023-12-23 ENCOUNTER — Telehealth: Payer: Self-pay | Admitting: Psychiatry

## 2023-12-23 NOTE — Telephone Encounter (Signed)
Pt LVM @ 9:07a requesting that his disability paperwork be sent it.  Looks like he had a visit on 12/19.  Next appt is with Arlys John on 1/27 (transferring from [place])

## 2023-12-23 NOTE — Telephone Encounter (Addendum)
LVM to [facility].  Pt returned call and he is asking to have note from 12/19 visit with [facility] sent in.

## 2023-12-27 ENCOUNTER — Telehealth: Payer: Self-pay | Admitting: Psychiatry

## 2023-12-27 NOTE — Telephone Encounter (Signed)
Received disability paperwork to be filled out. Pt has paid for form

## 2024-01-08 DIAGNOSIS — Z0289 Encounter for other administrative examinations: Secondary | ICD-10-CM

## 2024-01-09 ENCOUNTER — Telehealth: Payer: Self-pay | Admitting: Psychiatry

## 2024-01-09 NOTE — Telephone Encounter (Signed)
 See previous messages.

## 2024-01-09 NOTE — Telephone Encounter (Signed)
 I LVM @ 3:30p for pt that the forms have been sent to medical records request dept for fulfillment.

## 2024-01-09 NOTE — Telephone Encounter (Signed)
 Pt LVM @ 9:11a asking about papers that were to be sent to [facility] two weeks ago.  He will see Arlys John for the first time on 1/27(Current pt of [name]'s)

## 2024-01-09 NOTE — Telephone Encounter (Signed)
 See other phone message completed and faxed 01/08/24

## 2024-01-09 NOTE — Telephone Encounter (Signed)
 This was given to admin staff to send the DUE DATE for his paper work is 01/12/2024

## 2024-01-27 ENCOUNTER — Ambulatory Visit: Payer: 59 | Admitting: Behavioral Health

## 2024-01-27 ENCOUNTER — Encounter: Payer: Self-pay | Admitting: Behavioral Health

## 2024-01-27 DIAGNOSIS — F332 Major depressive disorder, recurrent severe without psychotic features: Secondary | ICD-10-CM

## 2024-01-27 MED ORDER — ASENAPINE MALEATE 5 MG SL SUBL: 5.0000 mg | SUBLINGUAL_TABLET | Freq: Every day | SUBLINGUAL | 2 refills | Status: DC

## 2024-01-27 NOTE — Progress Notes (Addendum)
[facility]  Patient ID: MARINUS EICHER,  MRN: [id number]  PCP: Pcp, No  Date of Evaluation: 01/27/2024 Time spent:45 minutes  Chief Complaint:  Chief Complaint   Anxiety; Depression; Follow-up; Medication Refill; Patient Education     HISTORY/CURRENT STATUS: HPI  DILLEN BELMONTES presents to the office today for follow-up of depression, anxiety, and insomnia. Collateral information should be considered reliable.  Appears distressed and is flat. Says that after stopping Asenapine about one month ago, due to weight gain, he decided to restart  a few days ago because he did believe medicine was helping mentally  with depression but not happy with weight gain.  Says he continues to have uncontrolled anxiety and rumination over the circumstances with legal trouble. Says that  he is unable to function at a normal level and does not believe he is able to go to work now until he has some finality with legal outcomes. Says his worry consumes him.  Panic attacks continue on regular basis.  Ambien continues to work moderately for insomnia.  Denies mania, denies hx of psychosis, no auditory or visual hallucination.  Denies suicidal intent. Contracts for safety.    Gets updates about his daughter from sister. Visiting son every other weekend with supervision.    He reports that he increased Wellbutrin XL up to 450 mg since this was his previous dose.    Past Psychiatric Medication Trials:  Seroquel- Helpful for insomnia Saphris-Helpful for insomnia. Caused weight gain.  Abilify Rexulti Vraylar-Ineffective. Muscle soreness Wellbutrin XL Auvelity- no improvement Viibryd- helpful for anxiety and depression. Gabapentin Ambien Ambien CR Lunesta-Ineffective Klonopin- reports effective for insomnia in the past.  Lithium- no improvement  Individual Medical History/ Review of Systems: Changes? :No   Allergies: Penicillins  Current Medications:  Current Outpatient Medications:     amLODipine (NORVASC) 5 MG tablet, Take 1 tablet by mouth daily., Disp: , Rfl:    asenapine (SAPHRIS) 5 MG SUBL 24 hr tablet, Place 1 tablet (5 mg total) under the tongue daily., Disp: 30 tablet, Rfl: 2   buPROPion (WELLBUTRIN XL) 150 MG 24 hr tablet, Take 3 tablets (450 mg total) by mouth daily., Disp: 270 tablet, Rfl: 0   esomeprazole (NEXIUM) 40 MG capsule, Take by mouth., Disp: , Rfl:    fluticasone (FLONASE) 50 MCG/ACT nasal spray, Place into both nostrils as needed for allergies or rhinitis., Disp: , Rfl:    gabapentin (NEURONTIN) 300 MG capsule, Take 1-2 capsules in the evening, Disp: 60 capsule, Rfl: 2   loratadine (CLARITIN) 10 MG tablet, Take 10 mg by mouth daily as needed for allergies., Disp: , Rfl:    losartan (COZAAR) 100 MG tablet, Take 100 mg by mouth daily., Disp: , Rfl:    meloxicam (MOBIC) 15 MG tablet, Take 15 mg by mouth daily., Disp: , Rfl:    metoprolol succinate (TOPROL-XL) 100 MG 24 hr tablet, Take 150 mg by mouth daily., Disp: , Rfl:    simvastatin (ZOCOR) 40 MG tablet, Take 40 mg by mouth., Disp: , Rfl:    Vilazodone HCl (VIIBRYD) 40 MG TABS, Take 1 tablet (40 mg total) by mouth daily., Disp: 90 tablet, Rfl: 1   zolpidem (AMBIEN CR) 12.5 MG CR tablet, Take 1 tablet (12.5 mg total) by mouth at bedtime as needed for sleep., Disp: 30 tablet, Rfl: 5 Medication Side Effects: none  Family Medical/ Social History: Changes? No  MENTAL HEALTH EXAM:  There were no vitals taken for this visit.There is no height or  weight on file to calculate BMI.  General Appearance: Casual, Neat, and Well Groomed  Eye Contact:  Good  Speech:  Clear and Coherent  Volume:  Decreased  Mood:  Anxious, Depressed, and Dysphoric  Affect:  Depressed, Flat, and Anxious  Thought Process:  Coherent  Orientation:  Full (Time, Place, and Person)  Thought Content: Logical   Suicidal Thoughts:  No  Homicidal Thoughts:  No  Memory:  WNL  Judgement:  Good  Insight:  Good  Psychomotor Activity:   Normal  Concentration:  Concentration: Good  Recall:  Good  Fund of Knowledge: Fair  Language: Fair  Assets:  Desire for Improvement Resilience Social Support  ADL's:  Intact  Cognition: WNL  Prognosis:  Good    DIAGNOSES:    ICD-10-CM   1. Severe episode of recurrent major depressive disorder, without psychotic features (HCC)  F33.2 asenapine (SAPHRIS) 5 MG SUBL 24 hr tablet      Receiving Psychotherapy: No    RECOMMENDATIONS:   45 minutes spent dedicated to the care of this patient on the date of this encounter to include pre-visit review of records, ordering of medication, post visit documentation, and face-to-face time with the patient discussing response to medications and treatment plan.  Pt restarted Asenapine  5 mg SUBL 24 hr daily without consulting this office. States that he wanted to retry even thought he was previously concerned about weight gain.  Stopped Lamictal. Stated that he increased his BP to high level requiring ER visit. Neck stiffness. Discussed potential benefits, risks, and side effects of treatment options and discussed that he has had tolerability issues with multiple atypical antipsychotics for augmentation of depression. Discussed considering other treatment options for augmentation of depression, including off-label treatment options.  Will continue Viibryd 40 mg daily since pt reports that he has experienced some benefit with Viibryd in terms of mood and anxiety.  Pt reports that he increased Wellbutrin XL from 150 mg to previous dose of 450 mg daily to improve depression. Will continue Wellbutrin XL 450 mg daily.  Will continue Ambien CR 12.5 mg po at bedtime prn insomnia.  Recommend extending leave through 02/28/24 with tentative return to work date of 02/28/24. Pt states that new documentation for [employer] needs to be completed monthly.    Patient advised to contact office with any questions, adverse effects, or acute worsening in signs and  symptoms. Provided Emergency Contact information Reviewed PDMP    Joan Flores, NP

## 2024-01-30 ENCOUNTER — Telehealth: Payer: Self-pay | Admitting: Behavioral Health

## 2024-01-30 NOTE — Telephone Encounter (Signed)
Disability paperwork was received for [facility]

## 2024-01-31 NOTE — Telephone Encounter (Signed)
Noted thanks will complete

## 2024-02-03 ENCOUNTER — Telehealth: Payer: Self-pay | Admitting: Behavioral Health

## 2024-02-03 NOTE — Telephone Encounter (Signed)
Khaliq called and LM at 3:24 to check status of paperwork for [facility].

## 2024-02-05 DIAGNOSIS — Z0289 Encounter for other administrative examinations: Secondary | ICD-10-CM

## 2024-02-06 NOTE — Telephone Encounter (Signed)
 Paperwork completed and given to Polly Brink to review and sign.

## 2024-02-06 NOTE — Telephone Encounter (Signed)
 Paperwork from [place] given to [employer] to fax along with office notes from 01/27/24

## 2024-02-24 ENCOUNTER — Encounter: Payer: Self-pay | Admitting: Behavioral Health

## 2024-02-24 ENCOUNTER — Ambulatory Visit: Payer: 59 | Admitting: Behavioral Health

## 2024-02-24 DIAGNOSIS — F332 Major depressive disorder, recurrent severe without psychotic features: Secondary | ICD-10-CM | POA: Diagnosis not present

## 2024-02-24 DIAGNOSIS — F411 Generalized anxiety disorder: Secondary | ICD-10-CM | POA: Diagnosis not present

## 2024-02-24 DIAGNOSIS — G4700 Insomnia, unspecified: Secondary | ICD-10-CM

## 2024-02-24 MED ORDER — ASENAPINE MALEATE 5 MG SL SUBL: 5.0000 mg | SUBLINGUAL_TABLET | Freq: Every day | SUBLINGUAL | 5 refills | Status: AC

## 2024-02-24 MED ORDER — GABAPENTIN 300 MG PO CAPS: ORAL_CAPSULE | ORAL | 5 refills | Status: AC

## 2024-02-24 MED ORDER — BUPROPION HCL ER (XL) 150 MG PO TB24: 450.0000 mg | ORAL_TABLET | Freq: Every day | ORAL | 1 refills | Status: AC

## 2024-02-24 MED ORDER — ZOLPIDEM TARTRATE ER 12.5 MG PO TBCR: 12.5000 mg | EXTENDED_RELEASE_TABLET | Freq: Every evening | ORAL | 5 refills | Status: AC | PRN

## 2024-02-24 NOTE — Progress Notes (Signed)
 [facility]  Patient ID: Alejandro Fitzgerald,  MRN: [id number]  PCP: Pcp, No  Date of Evaluation: 02/24/2024 Time spent:20 minutes  Chief Complaint:  Chief Complaint   Anxiety; Depression; Follow-up; Patient Education; Medication Refill     HISTORY/CURRENT STATUS: HPI    Alejandro Fitzgerald presents to the office today for follow-up of depression, anxiety, and insomnia. Collateral information should be considered reliable.  Appears distressed and is flat again. Medications are working "ok" for now.   Says he continues to have uncontrolled anxiety and rumination over the circumstances with legal trouble. Still reporting that he cannot function emotionally or safely on job due to medications. Has not had court date yet and no resolve.  Says his worry consumes him.  Panic attacks have lessened the last 4 weeks.  Ambien continues to work moderately for insomnia.  Denies mania, denies hx of psychosis, no auditory or visual hallucination.  Denies suicidal intent. Contracts for safety.    Gets updates about his daughter from sister. Visiting son every other weekend with supervision.    He reports that he increased Wellbutrin XL up to 450 mg since this was his previous dose.    Past Psychiatric Medication Trials:   Seroquel- Helpful for insomnia Saphris-Helpful for insomnia. Caused weight gain.  Abilify Rexulti Vraylar-Ineffective. Muscle soreness Wellbutrin XL Auvelity- no improvement Viibryd- helpful for anxiety and depression. Gabapentin Ambien Ambien CR Lunesta-Ineffective Klonopin- reports effective for insomnia in the past Lithium- no improvement    Individual Medical History/ Review of Systems: Changes? :No   Allergies: Penicillins  Current Medications:  Current Outpatient Medications:    amLODipine (NORVASC) 5 MG tablet, Take 1 tablet by mouth daily., Disp: , Rfl:    asenapine (SAPHRIS) 5 MG SUBL 24 hr tablet, Place 1 tablet (5 mg total) under the tongue  daily., Disp: 30 tablet, Rfl: 5   buPROPion (WELLBUTRIN XL) 150 MG 24 hr tablet, Take 3 tablets (450 mg total) by mouth daily., Disp: 270 tablet, Rfl: 1   esomeprazole (NEXIUM) 40 MG capsule, Take by mouth., Disp: , Rfl:    fluticasone (FLONASE) 50 MCG/ACT nasal spray, Place into both nostrils as needed for allergies or rhinitis., Disp: , Rfl:    gabapentin (NEURONTIN) 300 MG capsule, Take 1-2 capsules in the evening, Disp: 60 capsule, Rfl: 5   loratadine (CLARITIN) 10 MG tablet, Take 10 mg by mouth daily as needed for allergies., Disp: , Rfl:    losartan (COZAAR) 100 MG tablet, Take 100 mg by mouth daily., Disp: , Rfl:    meloxicam (MOBIC) 15 MG tablet, Take 15 mg by mouth daily., Disp: , Rfl:    metoprolol succinate (TOPROL-XL) 100 MG 24 hr tablet, Take 150 mg by mouth daily., Disp: , Rfl:    simvastatin (ZOCOR) 40 MG tablet, Take 40 mg by mouth., Disp: , Rfl:    Vilazodone HCl (VIIBRYD) 40 MG TABS, Take 1 tablet (40 mg total) by mouth daily., Disp: 90 tablet, Rfl: 1   zolpidem (AMBIEN CR) 12.5 MG CR tablet, Take 1 tablet (12.5 mg total) by mouth at bedtime as needed for sleep., Disp: 30 tablet, Rfl: 5 Medication Side Effects: none  Family Medical/ Social History: Changes? No  MENTAL HEALTH EXAM:  There were no vitals taken for this visit.There is no height or weight on file to calculate BMI.  General Appearance: Casual, Neat, and Well Groomed  Eye Contact:  Good  Speech:  Clear and Coherent  Volume:  Normal  Mood:  Anxious,  Depressed, Dysphoric, and Euphoric  Affect:  Depressed, Flat, and Anxious  Thought Process:  Coherent  Orientation:  Full (Time, Place, and Person)  Thought Content: Logical   Suicidal Thoughts:  No  Homicidal Thoughts:  No  Memory:  WNL  Judgement:  Good  Insight:  Good  Psychomotor Activity:  Normal  Concentration:  Concentration: Good  Recall:  Good  Fund of Knowledge: Fair  Language: Fair  Assets:  Desire for Improvement Physical  Health Resilience Social Support  ADL's:  Intact  Cognition: WNL  Prognosis:  Good    DIAGNOSES:    ICD-10-CM   1. Insomnia, unspecified type  G47.00 gabapentin (NEURONTIN) 300 MG capsule    zolpidem (AMBIEN CR) 12.5 MG CR tablet    2. GAD (generalized anxiety disorder)  F41.1 gabapentin (NEURONTIN) 300 MG capsule    3. Severe episode of recurrent major depressive disorder, without psychotic features (HCC)  F33.2 asenapine (SAPHRIS) 5 MG SUBL 24 hr tablet    buPROPion (WELLBUTRIN XL) 150 MG 24 hr tablet      Receiving Psychotherapy: No    20 minutes spent dedicated to the care of this patient on the date of this encounter to include pre-visit review of records, ordering of medication, post visit documentation, and face-to-face time with the patient discussing response to medications and treatment plan.   To continue Asenapine  5 mg SUBL 24 hr daily without consulting this office. States that he wanted to retry even thought he was previously concerned about weight gain.  Discussed potential benefits, risks, and side effects of treatment options and discussed that he has had tolerability issues with multiple atypical antipsychotics for augmentation of depression. Discussed considering other treatment options for augmentation of depression, including off-label treatment options.  Will continue Viibryd 40 mg daily since pt reports that he has experienced some benefit with Viibryd in terms of mood and anxiety.  Pt reports that he increased Wellbutrin XL from 150 mg to previous dose of 450 mg daily to improve depression. Will continue Wellbutrin XL 450 mg daily.  To continue gabapentin 1-2 capsules by mouth in the evening. Will continue Ambien CR 12.5 mg po at bedtime prn insomnia.  Recommend extending leave through 03/27/24 with tentative return to work date of 03/27/24. Pt states that new documentation for [employer] [facility] needs to be completed monthly.      Patient advised to contact office with  any questions, adverse effects, or acute worsening in signs and symptoms. Provided Emergency Contact information Reviewed PDMP    Joan Flores, NP

## 2024-03-23 ENCOUNTER — Ambulatory Visit: Payer: 59 | Admitting: Behavioral Health
